# Patient Record
Sex: Male | Born: 1965 | Race: White | Hispanic: Yes | Smoking: Never smoker
Health system: Southern US, Community
[De-identification: ages and names within clinical notes are randomized; demographics above are authoritative.]

---

## 2009-12-17 ENCOUNTER — Emergency Department (HOSPITAL_COMMUNITY): Admission: EM | Admit: 2009-12-17 | Discharge: 2009-12-17 | Payer: Self-pay | Admitting: Emergency Medicine

## 2010-04-14 LAB — DIFFERENTIAL
Basophils Relative: 0 % (ref 0–1)
Eosinophils Absolute: 0.3 10*3/uL (ref 0.0–0.7)
Eosinophils Relative: 2 % (ref 0–5)
Monocytes Absolute: 0.7 10*3/uL (ref 0.1–1.0)
Monocytes Relative: 7 % (ref 3–12)
Neutrophils Relative %: 58 % (ref 43–77)

## 2010-04-14 LAB — POCT I-STAT, CHEM 8
Calcium, Ion: 1.14 mmol/L (ref 1.12–1.32)
Glucose, Bld: 96 mg/dL (ref 70–99)
HCT: 50 % (ref 39.0–52.0)
Hemoglobin: 17 g/dL (ref 13.0–17.0)
TCO2: 27 mmol/L (ref 0–100)

## 2010-04-14 LAB — CBC
HCT: 45.6 % (ref 39.0–52.0)
Hemoglobin: 16 g/dL (ref 13.0–17.0)
MCH: 31.7 pg (ref 26.0–34.0)
MCHC: 35.1 g/dL (ref 30.0–36.0)

## 2011-07-21 ENCOUNTER — Telehealth: Payer: Self-pay | Admitting: Cardiology

## 2011-07-21 NOTE — Telephone Encounter (Signed)
Isaac Ellison states her birth date  is 28-Sep-1965.

## 2011-07-21 NOTE — Telephone Encounter (Signed)
This is not the right patient. The pt is Isaac Ellison.

## 2011-07-21 NOTE — Telephone Encounter (Signed)
Pt calling re heart meds

## 2012-01-27 ENCOUNTER — Encounter (HOSPITAL_COMMUNITY): Payer: Self-pay | Admitting: *Deleted

## 2012-01-27 ENCOUNTER — Emergency Department (HOSPITAL_COMMUNITY): Payer: Self-pay

## 2012-01-27 DIAGNOSIS — F17200 Nicotine dependence, unspecified, uncomplicated: Secondary | ICD-10-CM | POA: Insufficient documentation

## 2012-01-27 DIAGNOSIS — R9431 Abnormal electrocardiogram [ECG] [EKG]: Secondary | ICD-10-CM | POA: Insufficient documentation

## 2012-01-27 DIAGNOSIS — R079 Chest pain, unspecified: Principal | ICD-10-CM | POA: Insufficient documentation

## 2012-01-27 LAB — CBC WITH DIFFERENTIAL/PLATELET
Basophils Relative: 0 % (ref 0–1)
Eosinophils Absolute: 0.2 10*3/uL (ref 0.0–0.7)
HCT: 44.5 % (ref 39.0–52.0)
Hemoglobin: 15.7 g/dL (ref 13.0–17.0)
Lymphs Abs: 4.4 10*3/uL — ABNORMAL HIGH (ref 0.7–4.0)
MCH: 30.4 pg (ref 26.0–34.0)
MCHC: 35.3 g/dL (ref 30.0–36.0)
Monocytes Absolute: 0.5 10*3/uL (ref 0.1–1.0)
Monocytes Relative: 5 % (ref 3–12)

## 2012-01-27 LAB — COMPREHENSIVE METABOLIC PANEL
Albumin: 3.8 g/dL (ref 3.5–5.2)
BUN: 17 mg/dL (ref 6–23)
Chloride: 100 mEq/L (ref 96–112)
Creatinine, Ser: 0.88 mg/dL (ref 0.50–1.35)
GFR calc Af Amer: >90 mL/min (ref >90)
Glucose, Bld: 208 mg/dL — ABNORMAL HIGH (ref 70–99)
Total Bilirubin: 0.2 mg/dL — ABNORMAL LOW (ref 0.3–1.2)

## 2012-01-27 LAB — TROPONIN I: Troponin I: <0.3 ng/mL (ref <0.30)

## 2012-01-27 NOTE — ED Notes (Signed)
The pt is c/o some chest tightness all day intermittently/  He has a hx of anxiety.  History of the same

## 2012-01-28 ENCOUNTER — Observation Stay (HOSPITAL_COMMUNITY): Payer: Self-pay

## 2012-01-28 ENCOUNTER — Observation Stay (HOSPITAL_COMMUNITY)
Admission: EM | Admit: 2012-01-28 | Discharge: 2012-01-28 | Disposition: A | Discharge status: Home / Self Care | Payer: Self-pay | Attending: Emergency Medicine | Admitting: Emergency Medicine

## 2012-01-28 DIAGNOSIS — R079 Chest pain, unspecified: Secondary | ICD-10-CM

## 2012-01-28 LAB — TROPONIN I: Troponin I: <0.3 ng/mL (ref <0.30)

## 2012-01-28 MED ORDER — NITROGLYCERIN 0.4 MG SL SUBL
0.4000 mg | SUBLINGUAL_TABLET | Freq: Once | SUBLINGUAL | Status: AC
  Administered 2012-01-28: 0.4 mg via SUBLINGUAL

## 2012-01-28 MED ORDER — METOPROLOL TARTRATE 25 MG PO TABS
50.0000 mg | ORAL_TABLET | Freq: Once | ORAL | Status: AC
  Administered 2012-01-28: 50 mg via ORAL
  Filled 2012-01-28: qty 2

## 2012-01-28 MED ORDER — ASPIRIN 81 MG PO CHEW
324.0000 mg | CHEWABLE_TABLET | Freq: Once | ORAL | Status: AC
Start: 2012-01-28 — End: 2012-01-28
  Administered 2012-01-28: 324 mg via ORAL
  Filled 2012-01-28: qty 4

## 2012-01-28 MED ORDER — METOPROLOL TARTRATE 1 MG/ML IV SOLN
INTRAVENOUS | Status: AC
  Filled 2012-01-28: qty 10

## 2012-01-28 MED ORDER — IOHEXOL 350 MG/ML SOLN
80.0000 mL | Freq: Once | INTRAVENOUS | Status: AC | PRN
  Administered 2012-01-28: 80 mL via INTRAVENOUS

## 2012-01-28 MED ORDER — NITROGLYCERIN 0.4 MG SL SUBL
SUBLINGUAL_TABLET | SUBLINGUAL | Status: AC
  Administered 2012-01-28: 0.4 mg via SUBLINGUAL
  Filled 2012-01-28: qty 25

## 2012-01-28 NOTE — Progress Notes (Signed)
Utilization review completed.  P.J. Ellias Mcelreath,RN,BSN Case Manager [phone]  

## 2012-01-28 NOTE — ED Provider Notes (Signed)
In CDU on CPP with negative results reported. He has remained pain free and can be discharged home. Will give resource list for outpatient medical care.   Rodena Medin, PA-C 01/28/12 1147

## 2012-01-28 NOTE — ED Notes (Signed)
bmi is 30.8

## 2012-01-28 NOTE — ED Provider Notes (Signed)
History     CSN: [id number]  Arrival date & time 01/27/12  2122   First MD Initiated Contact with Patient 01/28/12 0119      Chief Complaint  Patient presents with  . Chest Pain    (Consider location/radiation/quality/duration/timing/severity/associated sxs/prior treatment) The history is provided by the patient.   on 7 PM tonight developed left-sided chest pain and tightness lasting about 3 hours. Denies any shortness of breath, nausea diaphoresis. 2 years ago had similar pain but has never had a stress test. Pain moderate in severity without known aggravating or alleviating factors. No history of hypertension, diabetes or hyperlipidemia. Is a smoker. No known family history of coronary artery disease. No radiation of pain. No trauma. No rash. No back pain. No leg pain or swelling. History reviewed. No pertinent past medical history.  History reviewed. No pertinent past surgical history.  No family history on file.  History  Substance Use Topics  . Smoking status: Current Every Day Smoker  . Smokeless tobacco: Not on file  . Alcohol Use: Yes      Review of Systems  Constitutional: Negative for fever and chills.  HENT: Negative for neck pain and neck stiffness.   Eyes: Negative for pain.  Respiratory: Negative for shortness of breath.   Cardiovascular: Positive for chest pain.  Gastrointestinal: Negative for abdominal pain.  Genitourinary: Negative for dysuria.  Musculoskeletal: Negative for back pain.  Skin: Negative for rash.  Neurological: Negative for headaches.  All other systems reviewed and are negative.    Allergies  Review of patient's allergies indicates no known allergies.  Home Medications   Current Outpatient Rx  Name  Route  Sig  Dispense  Refill  . ADULT MULTIVITAMIN W/MINERALS CH   Oral   Take 1 tablet by mouth daily.           BP 105/74  Pulse 62  Temp 98.1 F (36.7 C) (Oral)  Resp 15  SpO2 98%  Physical Exam  Constitutional: He  is oriented to person, place, and time. He appears well-developed and well-nourished.  HENT:  Head: Normocephalic and atraumatic.  Eyes: Conjunctivae normal and EOM are normal. Pupils are equal, round, and reactive to light.  Neck: Trachea normal. Neck supple. No thyromegaly present.  Cardiovascular: Normal rate, regular rhythm, S1 normal, S2 normal and normal pulses.     No systolic murmur is present   No diastolic murmur is present  Pulses:      Radial pulses are 2+ on the right side, and 2+ on the left side.  Pulmonary/Chest: Effort normal and breath sounds normal. He has no wheezes. He has no rhonchi. He has no rales. He exhibits no tenderness.  Abdominal: Soft. Normal appearance and bowel sounds are normal. There is no tenderness. There is no CVA tenderness and negative Murphy's sign.  Musculoskeletal:       BLE:s Calves nontender, no cords or erythema, negative Homans sign  Neurological: He is alert and oriented to person, place, and time. He has normal strength. No cranial nerve deficit or sensory deficit. GCS eye subscore is 4. GCS verbal subscore is 5. GCS motor subscore is 6.  Skin: Skin is warm and dry. No rash noted. He is not diaphoretic.  Psychiatric: His speech is normal.       Cooperative and appropriate    ED Course  Procedures (including critical care time)  Results for orders placed during the hospital encounter of 01/28/12  CBC WITH DIFFERENTIAL  Component Value Range   WBC 11.2 (*) 4.0 - 10.5 K/uL   RBC 5.16  4.22 - 5.81 MIL/uL   Hemoglobin 15.7  13.0 - 17.0 g/dL   HCT 45.4  09.8 - 11.9 %   MCV 86.2  78.0 - 100.0 fL   MCH 30.4  26.0 - 34.0 pg   MCHC 35.3  30.0 - 36.0 g/dL   RDW 14.7  82.9 - 56.2 %   Platelets 277  150 - 400 K/uL   Neutrophils Relative 54  43 - 77 %   Neutro Abs 6.0  1.7 - 7.7 K/uL   Lymphocytes Relative 39  12 - 46 %   Lymphs Abs 4.4 (*) 0.7 - 4.0 K/uL   Monocytes Relative 5  3 - 12 %   Monocytes Absolute 0.5  0.1 - 1.0 K/uL    Eosinophils Relative 2  0 - 5 %   Eosinophils Absolute 0.2  0.0 - 0.7 K/uL   Basophils Relative 0  0 - 1 %   Basophils Absolute 0.0  0.0 - 0.1 K/uL  COMPREHENSIVE METABOLIC PANEL      Component Value Range   Sodium 136  135 - 145 mEq/L   Potassium 3.6  3.5 - 5.1 mEq/L   Chloride 100  96 - 112 mEq/L   CO2 23  19 - 32 mEq/L   Glucose, Bld 208 (*) 70 - 99 mg/dL   BUN 17  6 - 23 mg/dL   Creatinine, Ser 1.30  0.50 - 1.35 mg/dL   Calcium 9.3  8.4 - 86.5 mg/dL   Total Protein 7.3  6.0 - 8.3 g/dL   Albumin 3.8  3.5 - 5.2 g/dL   AST 21  0 - 37 U/L   ALT 20  0 - 53 U/L   Alkaline Phosphatase 66  39 - 117 U/L   Total Bilirubin 0.2 (*) 0.3 - 1.2 mg/dL   GFR calc non Af Amer >90  >90 mL/min   GFR calc Af Amer >90  >90 mL/min  TROPONIN I      Component Value Range   Troponin I <0.30  <0.30 ng/mL  TROPONIN I      Component Value Range   Troponin I <0.30  <0.30 ng/mL   Dg Chest 2 View  01/27/2012  *RADIOLOGY REPORT*  Clinical Data: 46 year old male with chest pain.  CHEST - 2 VIEW  Comparison: 12/17/2009 chest radiograph  Findings: The cardiomediastinal silhouette is unremarkable. Probable COPD is noted. There is no evidence of focal airspace disease, pulmonary edema, suspicious pulmonary nodule/mass, pleural effusion, or pneumothorax. No acute bony abnormalities are identified.  IMPRESSION: No evidence of acute cardiopulmonary disease.  Probable COPD.   Original Report Authenticated By: Harmon Pier, M.D.      Date: 01/28/2012  Rate: 93  Rhythm: normal sinus rhythm  QRS Axis: normal  Intervals: normal  ST/T Wave abnormalities: nonspecific ST changes  Conduction Disutrbances:none  Narrative Interpretation:   Old EKG Reviewed: none available  ASA provided.   5'11, wt 230 lb  BMI 32.1 plan CDU OBS CCTA MDM   Chest pain low risk for ACS. Evaluated with EKG, labs and x-rays reviewed as above. Vital signs and nursing notes reviewed. Aspirin provided.  CDU observation protocol plan  coronary CTA in am        Sunnie Nielsen, MD 01/28/12 [phone]

## 2012-01-31 NOTE — Progress Notes (Signed)
UR completed for CDU observation pt 

## 2012-08-17 ENCOUNTER — Encounter (HOSPITAL_COMMUNITY): Payer: Self-pay | Admitting: Emergency Medicine

## 2012-08-17 ENCOUNTER — Emergency Department (HOSPITAL_COMMUNITY)
Admission: EM | Admit: 2012-08-17 | Discharge: 2012-08-17 | Disposition: A | Discharge status: Home / Self Care | Payer: Self-pay | Attending: Emergency Medicine | Admitting: Emergency Medicine

## 2012-08-17 DIAGNOSIS — R6883 Chills (without fever): Secondary | ICD-10-CM

## 2012-08-17 DIAGNOSIS — F17200 Nicotine dependence, unspecified, uncomplicated: Secondary | ICD-10-CM | POA: Insufficient documentation

## 2012-08-17 LAB — BASIC METABOLIC PANEL
Calcium: 8.6 mg/dL (ref 8.4–10.5)
Creatinine, Ser: 1.13 mg/dL (ref 0.50–1.35)
GFR calc non Af Amer: 77 mL/min — ABNORMAL LOW (ref >90)
Sodium: 135 mEq/L (ref 135–145)

## 2012-08-17 LAB — CBC WITH DIFFERENTIAL/PLATELET
Basophils Absolute: 0 10*3/uL (ref 0.0–0.1)
Eosinophils Absolute: 0.3 10*3/uL (ref 0.0–0.7)
Lymphocytes Relative: 41 % (ref 12–46)
Lymphs Abs: 3.5 10*3/uL (ref 0.7–4.0)
Neutrophils Relative %: 48 % (ref 43–77)
Platelets: 222 10*3/uL (ref 150–400)
RBC: 4.7 MIL/uL (ref 4.22–5.81)
RDW: 12.4 % (ref 11.5–15.5)
WBC: 8.5 10*3/uL (ref 4.0–10.5)

## 2012-08-17 NOTE — ED Provider Notes (Signed)
   History    CSN: [id number] Arrival date & time 08/17/12  0146  First MD Initiated Contact with Patient 08/17/12 0202     Chief Complaint  Patient presents with  . Chills   (Consider location/radiation/quality/duration/timing/severity/associated sxs/prior Treatment) HPI Comments: Pt comes in with cc of chills x 2 days. No medical hx, and states that the chills are not associated with uri like sx, myalgias, weight loss, loss in appetite, night sweats, cough. Pt also denies nausea, emesis, fevers, chills, chest pains, shortness of breath, headaches, abdominal pain, uti like symptoms.   The history is provided by the patient.   History reviewed. No pertinent past medical history. History reviewed. No pertinent past surgical history. No family history on file. History  Substance Use Topics  . Smoking status: Current Every Day Smoker  . Smokeless tobacco: Not on file  . Alcohol Use: Yes    Review of Systems  Constitutional: Positive for chills. Negative for fever, diaphoresis, activity change and appetite change.  Respiratory: Negative for cough and shortness of breath.   Cardiovascular: Negative for chest pain.  Gastrointestinal: Negative for abdominal pain.  Genitourinary: Negative for dysuria.    Allergies  Review of patient's allergies indicates no known allergies.  Home Medications   Current Outpatient Rx  Name  Route  Sig  Dispense  Refill  . Multiple Vitamin (MULTIVITAMIN WITH MINERALS) TABS   Oral   Take 1 tablet by mouth daily.          BP 127/84  Pulse 71  Temp(Src) 98.4 F (36.9 C) (Oral)  Resp 16  SpO2 98% Physical Exam  Constitutional: He is oriented to person, place, and time. He appears well-developed.  HENT:  Head: Normocephalic and atraumatic.  Eyes: Conjunctivae and EOM are normal. Pupils are equal, round, and reactive to light.  Neck: Normal range of motion. Neck supple.  Cardiovascular: Normal rate and regular rhythm.   Pulmonary/Chest:  Effort normal and breath sounds normal.  Abdominal: Soft. Bowel sounds are normal. He exhibits no distension. There is no tenderness. There is no rebound and no guarding.  Neurological: He is alert and oriented to person, place, and time.  Skin: Skin is warm.    ED Course  Procedures (including critical care time) Labs Reviewed  BASIC METABOLIC PANEL - Abnormal; Notable for the following:    Glucose, Bld 107 (*)    GFR calc non Af Amer 77 (*)    GFR calc Af Amer 90 (*)    All other components within normal limits  CBC WITH DIFFERENTIAL - Abnormal; Notable for the following:    MCHC 36.1 (*)    All other components within normal limits   No results found. 1. Chills (without fever)     MDM  Pt with night chills. No recent foreign travels. No red flags on ROS for any systemic infections, cancer. No night sweats. Will get basic screening labs, to ensure there was no concerns for leukemia, lymphoma. PCP f/u requested.     Derwood Kaplan, MD 08/17/12 [phone]

## 2012-08-17 NOTE — ED Notes (Signed)
Pt denies N/V/D, body aches, fever

## 2012-08-17 NOTE — ED Notes (Signed)
Nanavati MD at bedside 

## 2012-08-17 NOTE — ED Notes (Signed)
PT. REPORTS CHILLS " FEELS COLD" AND INTERMITTENT LEGS MUSCLE CRAMPS FOR 3 DAYS  , DENIES FEVER .

## 2012-08-23 ENCOUNTER — Ambulatory Visit: Payer: No Typology Code available for payment source | Attending: Family Medicine | Admitting: Internal Medicine

## 2012-08-23 VITALS — BP 102/69 | HR 56 | Temp 98.6°F | Resp 16 | Ht 70.0 in | Wt 213.0 lb

## 2012-08-23 DIAGNOSIS — Z0000 Encounter for general adult medical examination without abnormal findings: Secondary | ICD-10-CM

## 2012-08-23 NOTE — Progress Notes (Signed)
Patient ID: Isaac Ellison, male   DOB: 17-Sep-1965, 47 y.o.   MRN: [id number]   HPI: This is a 47 year old male who comes in to establish care. He has no complaints. No Known Allergies History reviewed. No pertinent past medical history. Prior to Admission medications   Medication Sig Start Date End Date Taking? Authorizing Provider  Multiple Vitamin (MULTIVITAMIN WITH MINERALS) TABS Take 1 tablet by mouth daily.    Historical Provider, MD   History reviewed. No pertinent family history. History   Social History  . Marital Status: Married    Spouse Name: N/A    Number of Children: N/A  . Years of Education: N/A   Occupational History  . Not on file.   Social History Main Topics  . Smoking status: Former Smoker    Quit date: 06/23/2012  . Smokeless tobacco: Not on file  . Alcohol Use: Yes  . Drug Use: Not on file  . Sexually Active: Not on file   Other Topics Concern  . Not on file   Social History Narrative  . No narrative on file    Review of Systems ______ Constitutional: Negative for fever, chills, diaphoresis, activity change, appetite change and fatigue. ____ HENT: Negative for ear pain, nosebleeds, congestion, facial swelling, rhinorrhea, neck pain, neck stiffness and ear discharge.  ____ Eyes: Negative for pain, discharge, redness, itching and visual disturbance. ____ Respiratory: Negative for cough, choking, chest tightness, shortness of breath, wheezing and stridor.  ____ Cardiovascular: Negative for chest pain, palpitations and leg swelling. ____ Gastrointestinal: Negative for Nausea/ Vomiting/ Diarrhea or Consitpation Genitourinary: Negative for dysuria, urgency, frequency, hematuria, flank pain, decreased urine volume, difficulty urinating and dyspareunia. ____ Musculoskeletal: Negative for back pain, joint swelling, arthralgias and gait problem. ________ Neurological: Negative for dizziness, tremors, seizures, syncope, facial asymmetry, speech  difficulty, weakness, light-headedness, numbness and headaches. ____ Hematological: Negative for adenopathy. Does not bruise/bleed easily. ____ Psychiatric/Behavioral: Negative for hallucinations, behavioral problems, confusion, dysphoric mood, decreased concentration and agitation. ______ Teeth bleed when brushing  Objective:   Filed Vitals:   08/23/12 1746  BP: 102/69  Pulse: 56  Temp: 98.6 F (37 C)  Resp: 16    Physical Exam ______ Constitutional: Appears well-developed and well-nourished. No distress. ____ HENT: Normocephalic. External right and left ear normal. Oropharynx is clear and moist. ____ Eyes: Conjunctivae and EOM are normal. PERRLA, no scleral icterus. ____ Neck: Normal ROM. Neck supple. No JVD. No tracheal deviation. No thyromegaly. ____ CVS: RRR, S1/S2 +, no murmurs, no gallops, no carotid bruit.  Pulmonary: Effort and breath sounds normal, no stridor, rhonchi, wheezes, rales.  Abdominal: Soft. BS +,  no distension, tenderness, rebound or guarding. ________ Musculoskeletal: Normal range of motion. No edema and no tenderness. ____ Lymphadenopathy: No lymphadenopathy noted, cervical, inguinal. Neuro: Alert. Normal reflexes, muscle tone coordination. No cranial nerve deficit. Skin: Skin is warm and dry. No rash noted. Not diaphoretic. No erythema. No pallor. ____ Psychiatric: Normal mood and affect. Behavior, judgment, thought content normal. __  Lab Results  Component Value Date   WBC 8.5 08/17/2012   HGB 14.9 08/17/2012   HCT 41.3 08/17/2012   MCV 87.9 08/17/2012   PLT 222 08/17/2012   Lab Results  Component Value Date   CREATININE 1.13 08/17/2012   BUN 22 08/17/2012   NA 135 08/17/2012   K 3.9 08/17/2012   CL 103 08/17/2012   CO2 27 08/17/2012    No results found for this basename: HGBA1C   Lipid Panel  No results  found for this basename: chol, trig, hdl, cholhdl, vldl, ldlcalc       Assessment and plan:   Patient Active Problem List   Diagnosis Date  Noted  . Annual physical exam 08/23/2012     No further workup at this time-patient is advised to return as needed.

## 2012-08-23 NOTE — Progress Notes (Signed)
Patient presents for Hospital follow up from last Sunday night. States that he was feeling very cold at night and couldn't sleep. States that during this time he has episodes of his, "mind going wild."

## 2012-08-28 ENCOUNTER — Encounter (HOSPITAL_COMMUNITY): Payer: Self-pay | Admitting: *Deleted

## 2012-08-28 ENCOUNTER — Emergency Department (HOSPITAL_COMMUNITY): Payer: Self-pay

## 2012-08-28 DIAGNOSIS — Z87891 Personal history of nicotine dependence: Secondary | ICD-10-CM | POA: Insufficient documentation

## 2012-08-28 DIAGNOSIS — R079 Chest pain, unspecified: Secondary | ICD-10-CM | POA: Insufficient documentation

## 2012-08-28 DIAGNOSIS — R1013 Epigastric pain: Secondary | ICD-10-CM | POA: Insufficient documentation

## 2012-08-28 DIAGNOSIS — A852 Arthropod-borne viral encephalitis, unspecified: Secondary | ICD-10-CM | POA: Insufficient documentation

## 2012-08-28 DIAGNOSIS — F411 Generalized anxiety disorder: Secondary | ICD-10-CM | POA: Insufficient documentation

## 2012-08-28 DIAGNOSIS — Z79899 Other long term (current) drug therapy: Secondary | ICD-10-CM | POA: Insufficient documentation

## 2012-08-28 NOTE — ED Notes (Addendum)
C/o chills. Also mentions acid reflux, indigestion & ulcer. Has been seen for similar twice in last 2 weeks. Has been chewing Tums after eating. Alert, NAD, calm, interactive, skin W&D, resps e/u, speaking in clear complete sentences. Last BM 3 hrs ago(normal), last ate 1800 (no problems). Denies nvd or fever. Noticed some blood when wiping after BM earlier, but not the last BM 3 hrs ago. Pt anxious about continued sx. Verbalizes anxiety.

## 2012-08-28 NOTE — ED Notes (Signed)
Pt states, "I was instructed by family to read about panic attacks, I don't panic like I use to".

## 2012-08-29 ENCOUNTER — Emergency Department (HOSPITAL_COMMUNITY)
Admission: EM | Admit: 2012-08-29 | Discharge: 2012-08-29 | Disposition: A | Discharge status: Home / Self Care | Payer: Self-pay | Attending: Emergency Medicine | Admitting: Emergency Medicine

## 2012-08-29 DIAGNOSIS — R079 Chest pain, unspecified: Secondary | ICD-10-CM

## 2012-08-29 DIAGNOSIS — R1013 Epigastric pain: Secondary | ICD-10-CM

## 2012-08-29 LAB — CBC WITH DIFFERENTIAL/PLATELET
Basophils Absolute: 0 10*3/uL (ref 0.0–0.1)
Eosinophils Relative: 3 % (ref 0–5)
HCT: 42.2 % (ref 39.0–52.0)
Lymphocytes Relative: 41 % (ref 12–46)
Lymphs Abs: 3.5 10*3/uL (ref 0.7–4.0)
MCV: 87.2 fL (ref 78.0–100.0)
Neutro Abs: 4.2 10*3/uL (ref 1.7–7.7)
Platelets: 243 10*3/uL (ref 150–400)
RBC: 4.84 MIL/uL (ref 4.22–5.81)
RDW: 12.5 % (ref 11.5–15.5)
WBC: 8.4 10*3/uL (ref 4.0–10.5)

## 2012-08-29 LAB — POCT I-STAT TROPONIN I: Troponin i, poc: 0 ng/mL (ref 0.00–0.08)

## 2012-08-29 MED ORDER — LORAZEPAM 1 MG PO TABS
1.0000 mg | ORAL_TABLET | Freq: Once | ORAL | Status: AC
  Administered 2012-08-29: 1 mg via ORAL
  Filled 2012-08-29: qty 1

## 2012-08-29 MED ORDER — FAMOTIDINE 20 MG PO TABS: 20.0000 mg | ORAL_TABLET | Freq: Two times a day (BID) | ORAL | Status: DC

## 2012-08-29 MED ORDER — GI COCKTAIL ~~LOC~~
30.0000 mL | Freq: Once | ORAL | Status: AC
  Administered 2012-08-29: 30 mL via ORAL
  Filled 2012-08-29: qty 30

## 2012-08-29 MED ORDER — PANTOPRAZOLE SODIUM 40 MG PO TBEC
40.0000 mg | DELAYED_RELEASE_TABLET | Freq: Once | ORAL | Status: AC
  Administered 2012-08-29: 40 mg via ORAL
  Filled 2012-08-29: qty 1

## 2012-08-29 NOTE — ED Provider Notes (Signed)
CSN: [id number]     Arrival date & time 08/28/12  2306 History     First MD Initiated Contact with Patient 08/29/12 0321     Chief Complaint  Patient presents with  . Chills  . Gastrophageal Reflux  . Anxiety   (Consider location/radiation/quality/duration/timing/severity/associated sxs/prior Treatment) Patient is a 47 y.o. male presenting with GERD and anxiety.  Gastrophageal Reflux Associated symptoms include chest pain and abdominal pain. Pertinent negatives include no headaches and no shortness of breath.  Anxiety Associated symptoms include chest pain and abdominal pain. Pertinent negatives include no headaches and no shortness of breath.   Hx per PT -  Onset 4 days ago, every time he eats, he gets epigastric "burning" pain, sometimes radiates into his chest.  He gets very worried when pain does not get better with tums.  No SOB, no nausea. No emesis. He also gets chills on and off, evaluated here last week for the same. No PCP. Symptoms MOD in severity. No back pain, no SOB. No leg pain or swelling. Minimal symptoms at this time. Spicy foods and tomato sauce seem to make this worse.   History reviewed. No pertinent past medical history. History reviewed. No pertinent past surgical history. No family history on file. History  Substance Use Topics  . Smoking status: Former Smoker    Quit date: 06/23/2012  . Smokeless tobacco: Not on file  . Alcohol Use: Yes    Review of Systems  Constitutional: Negative for fever and chills.  HENT: Negative for neck pain and neck stiffness.   Eyes: Negative for pain.  Respiratory: Negative for shortness of breath.   Cardiovascular: Positive for chest pain.  Gastrointestinal: Positive for abdominal pain.  Genitourinary: Negative for dysuria.  Musculoskeletal: Negative for back pain.  Skin: Negative for rash.  Neurological: Negative for headaches.  All other systems reviewed and are negative.    Allergies  Review of patient's  allergies indicates no known allergies.  Home Medications   Current Outpatient Rx  Name  Route  Sig  Dispense  Refill  . calcium carbonate (TUMS EX) 750 MG chewable tablet   Oral   Chew 1 tablet by mouth 3 (three) times daily as needed for heartburn.          . Multiple Vitamin (MULTIVITAMIN WITH MINERALS) TABS   Oral   Take 1 tablet by mouth daily.          BP 125/81  Temp(Src) 99 F (37.2 C) (Oral)  Resp 18  SpO2 97% Physical Exam  Constitutional: He is oriented to person, place, and time. He appears well-developed and well-nourished.  HENT:  Head: Normocephalic and atraumatic.  Eyes: EOM are normal. Pupils are equal, round, and reactive to light.  Neck: Neck supple.  Cardiovascular: Normal rate, regular rhythm and intact distal pulses.   Pulmonary/Chest: Effort normal and breath sounds normal. No respiratory distress.  Abdominal: Soft. Bowel sounds are normal. He exhibits no distension. There is no rebound and no guarding.  Mild epigastric tenderness  Musculoskeletal: Normal range of motion. He exhibits no edema.  Neurological: He is alert and oriented to person, place, and time.  Skin: Skin is warm and dry.  Psychiatric:  Anxious    ED Course   Procedures (including critical care time)   Date: 08/29/2012  Rate: 67  Rhythm: normal sinus rhythm  QRS Axis: normal  Intervals: normal  ST/T Wave abnormalities: nonspecific ST changes  Conduction Disutrbances:none  Narrative Interpretation:   Old EKG Reviewed: none  available  Results for orders placed during the hospital encounter of 08/29/12  CBC WITH DIFFERENTIAL      Result Value Range   WBC 8.4  4.0 - 10.5 K/uL   RBC 4.84  4.22 - 5.81 MIL/uL   Hemoglobin 15.1  13.0 - 17.0 g/dL   HCT 16.1  09.6 - 04.5 %   MCV 87.2  78.0 - 100.0 fL   MCH 31.2  26.0 - 34.0 pg   MCHC 35.8  30.0 - 36.0 g/dL   RDW 40.9  81.1 - 91.4 %   Platelets 243  150 - 400 K/uL   Neutrophils Relative % 50  43 - 77 %   Neutro Abs 4.2   1.7 - 7.7 K/uL   Lymphocytes Relative 41  12 - 46 %   Lymphs Abs 3.5  0.7 - 4.0 K/uL   Monocytes Relative 6  3 - 12 %   Monocytes Absolute 0.5  0.1 - 1.0 K/uL   Eosinophils Relative 3  0 - 5 %   Eosinophils Absolute 0.3  0.0 - 0.7 K/uL   Basophils Relative 0  0 - 1 %   Basophils Absolute 0.0  0.0 - 0.1 K/uL  POCT I-STAT TROPONIN I      Result Value Range   Troponin i, poc 0.00  0.00 - 0.08 ng/mL   Comment 3            Dg Chest 2 View  08/29/2012   *RADIOLOGY REPORT*  Clinical Data: Chills.  Gastroesophageal reflux.  CHEST - 2 VIEW  Comparison: CT 01/28/2012.  Findings:  Cardiopericardial silhouette within normal limits. Mediastinal contours normal. Trachea midline.  No airspace disease or effusion.  Linear scarring is present in the lingula.  No interval change compared to prior.  IMPRESSION: No active cardiopulmonary disease.   Original Report Authenticated By: Andreas Newport, M.D.    GI cocktail. Ativan. Protonix.  1. epigastric pain 2. chest pain  Plan discharge home with outpatient followup. Continue to calm this and add on Pepcid with dietary precautions provided. Return precautions verbalized as understood. States understanding of discharge and followup instructions. Outpatient referrals provided MDM  Epigastric burning pain worse after eating spicy food.   Low risk per ACS with associated chest pain.  Chest x-ray review as above. screening EKG reviewed as above Negative troponin with 4 days of symptoms, do not feel serial troponins are indicated at this point CBC reviewed, no anemia and no report of black or tarry stools  Improved with GI cocktail and medications provided  Vital signs the nurses notes reviewed and considered     Sunnie Nielsen, MD 08/29/12 [phone]

## 2012-08-30 ENCOUNTER — Emergency Department (HOSPITAL_COMMUNITY)
Admission: EM | Admit: 2012-08-30 | Discharge: 2012-08-31 | Disposition: A | Discharge status: Home / Self Care | Payer: Self-pay | Attending: Emergency Medicine | Admitting: Emergency Medicine

## 2012-08-30 ENCOUNTER — Encounter (HOSPITAL_COMMUNITY): Payer: Self-pay | Admitting: Emergency Medicine

## 2012-08-30 ENCOUNTER — Emergency Department (HOSPITAL_COMMUNITY): Payer: Self-pay

## 2012-08-30 DIAGNOSIS — F419 Anxiety disorder, unspecified: Secondary | ICD-10-CM

## 2012-08-30 DIAGNOSIS — F411 Generalized anxiety disorder: Secondary | ICD-10-CM | POA: Insufficient documentation

## 2012-08-30 DIAGNOSIS — R110 Nausea: Secondary | ICD-10-CM | POA: Insufficient documentation

## 2012-08-30 DIAGNOSIS — R0789 Other chest pain: Secondary | ICD-10-CM | POA: Insufficient documentation

## 2012-08-30 DIAGNOSIS — Z87891 Personal history of nicotine dependence: Secondary | ICD-10-CM | POA: Insufficient documentation

## 2012-08-30 DIAGNOSIS — R079 Chest pain, unspecified: Secondary | ICD-10-CM

## 2012-08-30 LAB — BASIC METABOLIC PANEL
CO2: 25 mEq/L (ref 19–32)
Chloride: 108 mEq/L (ref 96–112)
Glucose, Bld: 98 mg/dL (ref 70–99)
Potassium: 3.9 mEq/L (ref 3.5–5.1)
Sodium: 141 mEq/L (ref 135–145)

## 2012-08-30 LAB — CBC
Hemoglobin: 15.5 g/dL (ref 13.0–17.0)
MCH: 32 pg (ref 26.0–34.0)
Platelets: 247 10*3/uL (ref 150–400)
RBC: 4.85 MIL/uL (ref 4.22–5.81)
WBC: 8.5 10*3/uL (ref 4.0–10.5)

## 2012-08-30 LAB — POCT I-STAT TROPONIN I: Troponin i, poc: 0 ng/mL (ref 0.00–0.08)

## 2012-08-30 NOTE — ED Provider Notes (Signed)
CSN: [id number]     Arrival date & time 08/30/12  1853 History     First MD Initiated Contact with Patient 08/30/12 1935     Chief Complaint  Patient presents with  . Chest Pain   (Consider location/radiation/quality/duration/timing/severity/associated sxs/prior Treatment) HPI Isaac Ellison 47 y.o. who denies any specific past medical history presents with chest pain. Of note patient was seen here yesterday in multiple other times in the past year for similar symptoms. Chest pain started more than 1 month ago. He presents today because he noted that it was going into his left arm, and it made him nauseated. He describes the pain is pressure-like, radiates to his left arm, no known exacerbating or relieving factors, moderate in severity. He does note that he "when I think about the pain I become anxious and nervous and the pain becomes worse". He also notes "when I keep my mind busy with other things I think the pain gets better as well".  History reviewed. No pertinent past medical history. History reviewed. No pertinent past surgical history. History reviewed. No pertinent family history. History  Substance Use Topics  . Smoking status: Former Smoker    Quit date: 06/23/2012  . Smokeless tobacco: Not on file  . Alcohol Use: Yes    Review of Systems  Constitutional: Negative for fever, chills, diaphoresis, activity change, appetite change and fatigue.  HENT: Negative for congestion, sore throat, rhinorrhea, sneezing and trouble swallowing.   Eyes: Negative for pain and redness.  Respiratory: Positive for chest tightness. Negative for cough, choking, shortness of breath, wheezing and stridor.   Cardiovascular: Positive for chest pain. Negative for leg swelling.  Gastrointestinal: Positive for nausea. Negative for vomiting, diarrhea, constipation, blood in stool, abdominal distention and anal bleeding.  Musculoskeletal: Negative for myalgias and back pain.  Skin: Negative for  rash.  Neurological: Negative for dizziness, speech difficulty, weakness, light-headedness, numbness and headaches.  Hematological: Negative for adenopathy.  Psychiatric/Behavioral: Negative for confusion.    Allergies  Review of patient's allergies indicates no known allergies.  Home Medications   Current Outpatient Rx  Name  Route  Sig  Dispense  Refill  . famotidine (PEPCID) 20 MG tablet   Oral   Take 1 tablet (20 mg total) by mouth 2 (two) times daily.   30 tablet   0    BP 108/69  Pulse 57  Temp(Src) 98.5 F (36.9 C) (Oral)  Resp 17  SpO2 99% Physical Exam  Nursing note and vitals reviewed. Constitutional: He is oriented to person, place, and time. He appears well-developed and well-nourished. No distress.  HENT:  Head: Normocephalic and atraumatic.  Eyes: Conjunctivae and EOM are normal. Right eye exhibits no discharge. Left eye exhibits no discharge.  Neck: Normal range of motion. Neck supple. No tracheal deviation present.  Cardiovascular: Normal rate, regular rhythm and normal heart sounds.  Exam reveals no friction rub.   No murmur heard. Pulmonary/Chest: Effort normal and breath sounds normal. No stridor. No respiratory distress. He has no wheezes. He has no rales. He exhibits no tenderness.  Abdominal: Soft. He exhibits no distension. There is no tenderness. There is no rebound and no guarding.  Neurological: He is alert and oriented to person, place, and time.  Skin: Skin is warm.  Psychiatric: He has a normal mood and affect.    ED Course   Procedures (including critical care time)  Labs Reviewed  CBC - Abnormal; Notable for the following:    MCHC 36.6 (*)  All other components within normal limits  BASIC METABOLIC PANEL - Abnormal; Notable for the following:    GFR calc non Af Amer 83 (*)    All other components within normal limits  POCT I-STAT TROPONIN I  POCT I-STAT TROPONIN I   Results for orders placed during the hospital encounter of  08/30/12  CBC      Result Value Range   WBC 8.5  4.0 - 10.5 K/uL   RBC 4.85  4.22 - 5.81 MIL/uL   Hemoglobin 15.5  13.0 - 17.0 g/dL   HCT 16.1  09.6 - 04.5 %   MCV 87.4  78.0 - 100.0 fL   MCH 32.0  26.0 - 34.0 pg   MCHC 36.6 (*) 30.0 - 36.0 g/dL   RDW 40.9  81.1 - 91.4 %   Platelets 247  150 - 400 K/uL  BASIC METABOLIC PANEL      Result Value Range   Sodium 141  135 - 145 mEq/L   Potassium 3.9  3.5 - 5.1 mEq/L   Chloride 108  96 - 112 mEq/L   CO2 25  19 - 32 mEq/L   Glucose, Bld 98  70 - 99 mg/dL   BUN 20  6 - 23 mg/dL   Creatinine, Ser 7.82  0.50 - 1.35 mg/dL   Calcium 9.2  8.4 - 95.6 mg/dL   GFR calc non Af Amer 83 (*) >90 mL/min   GFR calc Af Amer >90  >90 mL/min  POCT I-STAT TROPONIN I      Result Value Range   Troponin i, poc 0.00  0.00 - 0.08 ng/mL   Comment 3           POCT I-STAT TROPONIN I      Result Value Range   Troponin i, poc 0.00  0.00 - 0.08 ng/mL   Comment 3             Dg Chest 2 View  08/30/2012   *RADIOLOGY REPORT*  Clinical Data: Chest pain and pressure.  CHEST - 2 VIEW  Comparison: 08/28/2012  Findings: No pneumothorax.  Spurring in the lower thoracic spine. Lungs clear.  Heart size and pulmonary vascularity normal.  No effusion.  IMPRESSION: No acute disease   Original Report Authenticated By: D. Andria Rhein, MD   1. Chest pain   2. Anxiety       Date: 08/30/2012  Rate: 64  Rhythm: normal sinus rhythm  QRS Axis: normal  Intervals: normal  ST/T Wave abnormalities: nonspecific T wave changes  Conduction Disutrbances:none  Narrative Interpretation: NSR unchanged from EKG on 08/29/2012  Old EKG Reviewed: unchanged   MDM  Lennox Laity 47 y.o. present for chest pain. He's been seen multiple times for chest pain in the past year. Workup has included a CT coronary study in December 2013 which showed no evidence of coronary artery disease. He was also seen yesterday where there was no cardiac etiology determined the symptoms. Patient reports  no risk factors including high blood pressure or high cholesterol, although this is noted in his past medical history. He's afebrile vital signs are stable. Is in no acute distress. No respiratory distress. Protocol driven labs obtained. Troponin negative. Hemoglobin stable. Chest x-ray shows no pneumonia or pneumothorax. EKG is normal sinus rhythm and unchanged from yesterday's and prior. Patient has a HEART score of less than 3, which categorizes in his low risk. Will obtain a delta troponin and if negative patient is safe to be discharged.  How low suspicion for ACS at this time.  Delta troponin negative. A low suspicion etiology of chest pain is cardiac in origin.  Patient was given information establish care with primary medical doctor. He was instructed to followup for her symptoms. I suspect there is a component of anxiety. Given the history described above. He was given strong return precautions.  Labs, EKG, and imaging reviewed. I discussed patient's care at my attending, Dr. Loretha Stapler.     Sena Hitch, MD 08/31/12 [phone]

## 2012-08-30 NOTE — ED Notes (Signed)
Pt to ED for evaluation of left sided chest pain that radiates to his left arm that began this morning- shortness of breath associated with symptoms.  Admits to being under increased stress this week with his living situation and family.  Pt also concerned because [employer] informed him that their fruit was exposed to a bacteria, pt states he has eaten their fruit and concerned that could be where his symptoms are coming from.  Pt anxious at present, alert and oriented X 4 - pt on cardiac monitor, will continue to monitor pt.

## 2012-08-30 NOTE — ED Notes (Signed)
Pt c/o chest pressure with radiation to left arm starting today; pt seen here for similar recently

## 2012-08-31 ENCOUNTER — Ambulatory Visit: Payer: Self-pay | Attending: Family Medicine

## 2012-08-31 DIAGNOSIS — R079 Chest pain, unspecified: Secondary | ICD-10-CM | POA: Diagnosis present

## 2012-08-31 DIAGNOSIS — F419 Anxiety disorder, unspecified: Secondary | ICD-10-CM | POA: Diagnosis present

## 2012-08-31 LAB — POCT I-STAT TROPONIN I

## 2012-09-01 NOTE — ED Provider Notes (Signed)
I saw and evaluated the patient, reviewed the resident's note and I agree with the findings and plan.  47 yo male with chest pain.  Well appearing, no distress, and normal heart sounds.  History suggests anxiety as most likely cause of chest pain.    Candyce Churn, MD 09/01/12 1331

## 2012-09-25 ENCOUNTER — Ambulatory Visit: Payer: No Typology Code available for payment source | Attending: Internal Medicine | Admitting: Internal Medicine

## 2012-09-25 ENCOUNTER — Encounter: Payer: Self-pay | Admitting: Internal Medicine

## 2012-09-25 VITALS — BP 114/78 | HR 66 | Temp 98.6°F | Resp 16 | Ht 70.0 in | Wt 211.0 lb

## 2012-09-25 DIAGNOSIS — K029 Dental caries, unspecified: Secondary | ICD-10-CM | POA: Insufficient documentation

## 2012-09-25 DIAGNOSIS — Z0000 Encounter for general adult medical examination without abnormal findings: Secondary | ICD-10-CM

## 2012-09-25 DIAGNOSIS — R079 Chest pain, unspecified: Secondary | ICD-10-CM | POA: Insufficient documentation

## 2012-09-25 DIAGNOSIS — F411 Generalized anxiety disorder: Secondary | ICD-10-CM | POA: Insufficient documentation

## 2012-09-25 MED ORDER — CARBAMIDE PEROXIDE 6.5 % OT SOLN: 5.0000 [drp] | Freq: Two times a day (BID) | OTIC | Status: DC

## 2012-09-25 NOTE — Progress Notes (Signed)
Patient ID: Isaac Ellison, male   DOB: 1965-07-05, 47 y.o.   MRN: [id number]  CC: Annual physical  HPI: Patient is a 47 years old male who came to clinic today for physical. He has no specific complaint except for tooth ache for which he wants a referral to dentist. Denies chest pain, no headache, no abdominal pain, no change in bowel habit, no urinary symptoms.  No Known Allergies History reviewed. No pertinent past medical history. Current Outpatient Prescriptions on File Prior to Visit  Medication Sig Dispense Refill  . famotidine (PEPCID) 20 MG tablet Take 1 tablet (20 mg total) by mouth 2 (two) times daily.  30 tablet  0   No current facility-administered medications on file prior to visit.   History reviewed. No pertinent family history. History   Social History  . Marital Status: Married    Spouse Name: N/A    Number of Children: N/A  . Years of Education: N/A   Occupational History  . Not on file.   Social History Main Topics  . Smoking status: Former Smoker    Quit date: 06/23/2012  . Smokeless tobacco: Not on file  . Alcohol Use: Yes  . Drug Use: Not on file  . Sexual Activity: Not on file   Other Topics Concern  . Not on file   Social History Narrative  . No narrative on file    Review of Systems: Constitutional: Negative for fever, chills, diaphoresis, activity change, appetite change and fatigue. HENT: Negative for ear pain, nosebleeds, congestion, facial swelling, rhinorrhea, neck pain, neck stiffness and ear discharge.  Eyes: Negative for pain, discharge, redness, itching and visual disturbance. Respiratory: Negative for cough, choking, chest tightness, shortness of breath, wheezing and stridor.  Cardiovascular: Negative for chest pain, palpitations and leg swelling. Gastrointestinal: Negative for abdominal distention. Genitourinary: Negative for dysuria, urgency, frequency, hematuria, flank pain, decreased urine volume, difficulty urinating and  dyspareunia.  Musculoskeletal: Negative for back pain, joint swelling, arthralgias and gait problem. Neurological: Negative for dizziness, tremors, seizures, syncope, facial asymmetry, speech difficulty, weakness, light-headedness, numbness and headaches.  Hematological: Negative for adenopathy. Does not bruise/bleed easily. Psychiatric/Behavioral: Negative for hallucinations, behavioral problems, confusion, dysphoric mood, decreased concentration and agitation.    Objective:   Filed Vitals:   09/25/12 1712  BP: 114/78  Pulse: 66  Temp: 98.6 F (37 C)  Resp: 16    Physical Exam: Constitutional: Patient appears well-developed and well-nourished. No distress. HENT: Normocephalic, atraumatic, External right and left ear normal. Oropharynx is clear and moist.  Eyes: Conjunctivae and EOM are normal. PERRLA, no scleral icterus. Neck: Normal ROM. Neck supple. No JVD. No tracheal deviation. No thyromegaly. CVS: RRR, S1/S2 +, no murmurs, no gallops, no carotid bruit.  Pulmonary: Effort and breath sounds normal, no stridor, rhonchi, wheezes, rales.  Abdominal: Soft. BS +,  no distension, tenderness, rebound or guarding.  Musculoskeletal: Normal range of motion. No edema and no tenderness.  Lymphadenopathy: No lymphadenopathy noted, cervical, inguinal or axillary Neuro: Alert. Normal reflexes, muscle tone coordination. No cranial nerve deficit. Skin: Multiple tiny hemangiomas on the trunk Psychiatric: Normal mood and affect. Behavior, judgment, thought content normal.  Lab Results  Component Value Date   WBC 8.5 08/30/2012   HGB 15.5 08/30/2012   HCT 42.4 08/30/2012   MCV 87.4 08/30/2012   PLT 247 08/30/2012   Lab Results  Component Value Date   CREATININE 1.07 08/30/2012   BUN 20 08/30/2012   NA 141 08/30/2012   K 3.9 08/30/2012  CL 108 08/30/2012   CO2 25 08/30/2012    No results found for this basename: HGBA1C   Lipid Panel  No results found for this basename: chol, trig, hdl,  cholhdl, vldl, ldlcalc       Assessment and plan:   Patient Active Problem List   Diagnosis Date Noted  . Dental caries 09/25/2012  . Routine history and physical examination of adult 09/25/2012  . Chest pain 08/31/2012  . Anxiety 08/31/2012  . Annual physical exam 08/23/2012   Ambulatory referral to dentist for dental caries General physical examination done Copious ear wax both ears  Labs: CMP CBC D. Complete urinalysis Lipid panel TSH  Patient counseled about nutrition and exercise Patient encouraged to reduce alcohol consumption Ear wax removal drops prescribed  Johnluke Haugen was given clear instructions to go to ER or return to the clinic if symptoms don't improve, worsen or new problems develop.  Mildred Rodriguez-Romero verbalized understanding.  Tage Feggins was told to call to get lab results if hasn't heard anything in the next week.        Jeanann Lewandowsky, MD [facility] [place], [place] [phone]   09/25/2012, 5:48 PM

## 2012-09-25 NOTE — Progress Notes (Signed)
PT HERE FOR ANNUAL PHYSICAL AND DENTAL REFERRAL. DENIES PAIN AT THIS TIME.VSS

## 2012-09-26 LAB — CBC WITH DIFFERENTIAL/PLATELET
Basophils Absolute: 0 10*3/uL (ref 0.0–0.1)
Basophils Relative: 0 % (ref 0–1)
Eosinophils Absolute: 0.2 10*3/uL (ref 0.0–0.7)
MCH: 30.5 pg (ref 26.0–34.0)
MCHC: 35 g/dL (ref 30.0–36.0)
Monocytes Relative: 6 % (ref 3–12)
Neutrophils Relative %: 57 % (ref 43–77)
Platelets: 247 10*3/uL (ref 150–400)
RDW: 13.2 % (ref 11.5–15.5)

## 2012-09-26 LAB — COMPLETE METABOLIC PANEL WITH GFR
ALT: 16 U/L (ref 0–53)
Albumin: 4.3 g/dL (ref 3.5–5.2)
CO2: 23 mEq/L (ref 19–32)
Calcium: 9 mg/dL (ref 8.4–10.5)
Chloride: 107 mEq/L (ref 96–112)
GFR, Est African American: >89 mL/min
Glucose, Bld: 82 mg/dL (ref 70–99)
Sodium: 138 mEq/L (ref 135–145)
Total Protein: 7 g/dL (ref 6.0–8.3)

## 2012-09-26 LAB — URINALYSIS, COMPLETE
Glucose, UA: NEGATIVE mg/dL
Leukocytes, UA: NEGATIVE
Protein, ur: NEGATIVE mg/dL
Squamous Epithelial / LPF: NONE SEEN

## 2012-09-26 LAB — LIPID PANEL: Total CHOL/HDL Ratio: 4.2 Ratio

## 2012-09-26 LAB — TSH: TSH: 2.778 u[IU]/mL (ref 0.350–4.500)

## 2012-12-04 ENCOUNTER — Ambulatory Visit: Payer: No Typology Code available for payment source | Attending: Internal Medicine

## 2012-12-04 VITALS — BP 132/92 | HR 69 | Temp 98.8°F | Resp 16

## 2012-12-04 DIAGNOSIS — H538 Other visual disturbances: Secondary | ICD-10-CM

## 2012-12-04 NOTE — Progress Notes (Unsigned)
Pt assesed and told to get Zaditor OTC. If not working in 2 dys return to clinic

## 2012-12-04 NOTE — Progress Notes (Unsigned)
Pt here as walk in with c/o itchy watery eyes with L eye redness,slight yellow crust Denies fever,chills,n,v Sx started last Wednesday. otc meds not working

## 2012-12-08 ENCOUNTER — Ambulatory Visit: Payer: No Typology Code available for payment source | Attending: Internal Medicine

## 2012-12-08 VITALS — BP 103/65 | HR 74 | Temp 98.6°F | Resp 16

## 2012-12-08 DIAGNOSIS — Z23 Encounter for immunization: Secondary | ICD-10-CM

## 2012-12-08 MED ORDER — OFLOXACIN 0.3 % OP SOLN: 1.0000 [drp] | Freq: Four times a day (QID) | OPHTHALMIC | Status: DC

## 2012-12-08 NOTE — Progress Notes (Unsigned)
Pt returns today with increased eye itching,watery and yellow d/c. Denies blurred vision or pain Prescribed Ofloxacin 0.3mg /ml drops Flu vaccine given

## 2012-12-12 ENCOUNTER — Ambulatory Visit: Payer: No Typology Code available for payment source | Attending: Internal Medicine | Admitting: Internal Medicine

## 2012-12-12 ENCOUNTER — Encounter: Payer: Self-pay | Admitting: Internal Medicine

## 2012-12-12 VITALS — BP 106/68 | HR 68 | Temp 98.9°F | Resp 18 | Wt 220.0 lb

## 2012-12-12 DIAGNOSIS — B309 Viral conjunctivitis, unspecified: Secondary | ICD-10-CM

## 2012-12-12 DIAGNOSIS — H109 Unspecified conjunctivitis: Secondary | ICD-10-CM | POA: Insufficient documentation

## 2012-12-12 NOTE — Progress Notes (Signed)
Patient ID: Isaac Ellison, male   DOB: 1965/06/16, 47 y.o.   MRN: [id number] Patient Demographics  Isaac Ellison, is a 47 y.o. male  WUJ:[id number]  [id number]  DOB - 09-07-1965  Chief Complaint  Patient presents with  . Eye Problem        Subjective:   Isaac Ellison is a 47 y.o. male here today for a follow up visit. Patient was seen recently for viral conjunctivitis of the left eye, that has resolved but now the right eye is involved, red and painful, watery, sticky in the morning. As a contact with someone with the same conjunctivitis at work prior to the first symptom. Patient is currently using ofloxacin ophthalmic solution.  Patient has No headache, No chest pain, No abdominal pain - No Nausea, No new weakness tingling or numbness, No Cough - SOB.  ALLERGIES: No Known Allergies  PAST MEDICAL HISTORY: History reviewed. No pertinent past medical history.  MEDICATIONS AT HOME: Prior to Admission medications   Medication Sig Start Date End Date Taking? Authorizing Provider  ofloxacin (OCUFLOX) 0.3 % ophthalmic solution Place 1 drop into both eyes 4 (four) times daily. 12/08/12  Yes Jeanann Lewandowsky, MD  carbamide peroxide (EAR WAX REMOVAL DROPS) 6.5 % otic solution Place 5 drops into both ears 2 (two) times daily. 09/25/12   Jeanann Lewandowsky, MD  famotidine (PEPCID) 20 MG tablet Take 1 tablet (20 mg total) by mouth 2 (two) times daily. 08/29/12   Sunnie Nielsen, MD     Objective:   Filed Vitals:   12/12/12 0925  BP: 106/68  Pulse: 68  Temp: 98.9 F (37.2 C)  TempSrc: Oral  Resp: 18  Weight: 220 lb (99.791 kg)  SpO2: 97%    Exam General appearance : Awake, alert, not in any distress. Speech Clear. Not toxic looking HEENT: Atraumatic and Normocephalic, obvious right and pink eye, watery, pupils equally reactive to light and accomodation Neck: supple, no JVD. No cervical lymphadenopathy.  Chest:Good air entry bilaterally, no added sounds   CVS: S1 S2 regular, no murmurs.  Abdomen: Bowel sounds present, Non tender and not distended with no gaurding, rigidity or rebound. Extremities: B/L Lower Ext shows no edema, both legs are warm to touch Neurology: Awake alert, and oriented X 3, CN II-XII intact, Non focal Skin:No Rash Wounds:N/A   Data Review   CBC No results found for this basename: WBC, HGB, HCT, PLT, MCV, MCH, MCHC, RDW, NEUTRABS, LYMPHSABS, MONOABS, EOSABS, BASOSABS, BANDABS, BANDSABD,  in the last 168 hours  Chemistries   No results found for this basename: NA, K, CL, CO2, GLUCOSE, BUN, CREATININE, GFRCGP, CALCIUM, MG, AST, ALT, ALKPHOS, BILITOT,  in the last 168 hours ------------------------------------------------------------------------------------------------------------------ No results found for this basename: HGBA1C,  in the last 72 hours ------------------------------------------------------------------------------------------------------------------ No results found for this basename: CHOL, HDL, LDLCALC, TRIG, CHOLHDL, LDLDIRECT,  in the last 72 hours ------------------------------------------------------------------------------------------------------------------ No results found for this basename: TSH, T4TOTAL, FREET3, T3FREE, THYROIDAB,  in the last 72 hours ------------------------------------------------------------------------------------------------------------------ No results found for this basename: VITAMINB12, FOLATE, FERRITIN, TIBC, IRON, RETICCTPCT,  in the last 72 hours  Coagulation profile  No results found for this basename: INR, PROTIME,  in the last 168 hours    Assessment & Plan   Patient Active Problem List   Diagnosis Date Noted  . Viral conjunctivitis 12/12/2012  . Dental caries 09/25/2012  . Routine history and physical examination of adult 09/25/2012  . Chest pain 08/31/2012  . Anxiety 08/31/2012  . Annual physical exam 08/23/2012  Plan: Continue oxacillin  ophthalmic solution Patient counseled and educated about pinkeye and its course especially when caused by viral infection Patient given instruction to return in 2 days if there is no improvement Patient was excused from duties for the next 4 days  Patient was counseled about exercise and nutrition  Follow up in 2 days if no improvement otherwise we'll see in 3 months or when necessary  The patient was given clear instructions to go to ER or return to medical center if symptoms don't improve, worsen or new problems develop. The patient verbalized understanding. The patient was told to call to get lab results if they haven't heard anything in the next week.    Jeanann Lewandowsky, MD, MHA, FACP, FAAP [facility] [place], [place] [phone]   12/12/2012, 9:56 AM

## 2012-12-12 NOTE — Progress Notes (Signed)
Pt is here for persistent redness, itchiness and watery of right eye Seen here on 11/3 and 11/7 for the same problem... Given Ofloxacin w/no relief States left eye has gotten better but right eye is worse.  Sxs include: crusty eyes in the am, blurry vision and puffiness around eye.  Denies: pain, inj/trauma, f/v/n/d... 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

## 2012-12-12 NOTE — Patient Instructions (Signed)
Viral Conjunctivitis Conjunctivitis is an irritation (inflammation) of the clear membrane that covers the white part of the eye (the conjunctiva). The irritation can also happen on the underside of the eyelids. Conjunctivitis makes the eye red or pink in color. This is what is commonly known as pink eye. Viral conjunctivitis can spread easily (contagious). CAUSES   Infection from virus on the surface of the eye.  Infection from the irritation or injury of nearby tissues such as the eyelids or cornea.  More serious inflammation or infection on the inside of the eye.  Other eye diseases.  The use of certain eye medications. SYMPTOMS  The normally white color of the eye or the underside of the eyelid is usually pink or red in color. The pink eye is usually associated with irritation, tearing and some sensitivity to light. Viral conjunctivitis is often associated with a clear, watery discharge. If a discharge is present, there may also be some blurred vision in the affected eye. DIAGNOSIS  Conjunctivitis is diagnosed by an eye exam. The eye specialist looks for changes in the surface tissues of the eye which take on changes characteristic of the specific types of conjunctivitis. A sample of any discharge may be collected on a Q-Tip (sterile swap). The sample will be sent to a lab to see whether or not the inflammation is caused by bacterial or viral infection. TREATMENT  Viral conjunctivitis will not respond to medicines that kill germs (antibiotics). Treatment is aimed at stopping a bacterial infection on top of the viral infection. The goal of treatment is to relieve symptoms (such as itching) with antihistamine drops or other eye medications.  HOME CARE INSTRUCTIONS   To ease discomfort, apply a cool, clean wash cloth to your eye for 10 to 20 minutes, 3 to 4 times a day.  Gently wipe away any drainage from the eye with a warm, wet washcloth or a cotton ball.  Wash your hands often with soap  and use paper towels to dry.  Do not share towels or washcloths. This may spread the infection.  Change or wash your pillowcase every day.  You should not use eye make-up until the infection is gone.  Stop using contacts lenses. Ask your eye professional how to sterilize or replace them before using again. This depends on the type of contact lenses used.  Do not touch the edge of the eyelid with the eye drop bottle or ointment tube when applying medications to the affected eye. This will stop you from spreading the infection to the other eye or to others. SEEK IMMEDIATE MEDICAL CARE IF:   The infection has not improved within 3 days of beginning treatment.  A watery discharge from the eye develops.  Pain in the eye increases.  The redness is spreading.  Vision becomes blurred.  An oral temperature above 102 F (38.9 C) develops, or as your caregiver suggests.  Facial pain, redness or swelling develops.  Any problems that may be related to the prescribed medicine develop. MAKE SURE YOU:   Understand these instructions.  Will watch your condition.  Will get help right away if you are not doing well or get worse. Document Released: 01/18/2005 Document Revised: 04/12/2011 Document Reviewed: 09/07/2007 [facility] Patient Information 2014 [place], [place]. Conjuntivitis (Conjunctivitis) Usted padece conjuntivitis. La conjuntivitis se conoce frecuentemente como "ojo rojo". Las causas de la conjuntivitis pueden ser las infecciones virales o [place], Environmental consultant o lesiones. Los sntomas son: enrojecimiento de la superficie del ojo, [place], [place] y en  algunos casos, secreciones. La secrecin se deposita en las pestaas. Las infecciones virales causan una secrecin acuosa, mientras que las infecciones bacterianas causan una secrecin amarillenta y espesa. La conjuntivitis es muy contagiosa y se disemina por el contacto directo. [employer] parte del tratamiento le indicaran gotas oftlmicas  con antibiticos. Antes de [employer], retire todas la secreciones del ojo, lavndolo suavemente con agua tibia y algodn. Contine con el uso del medicamento hasta que se haya [employer] sin secrecin ocular. No se frote los ojos. Esto hace que aumente la irritacin y favorece la extensin de la infeccin. No utilice las [employer] miembros de [place]. Lvese las manos con agua y [place] antes y despus de tocarse los ojos. Utilice compresas fras para reducir Chief Technology Officer y anteojos de sol para disminuir la irritacin que ocasiona la luz. No debe usarse maquillaje ni lentes de contacto hasta que la infeccin haya desaparecido. SOLICITE ATENCIN MDICA SI:  Sus sntomas no mejoran luego de [address]  [address] de [place].  Aumenta el dolor o las dificultades para ver.  La zona externa de los prpados est muy roja o hinchada. Document Released: 01/18/2005 Document Revised: 04/12/2011 [facility] Patient Information 2014 [place], [place].

## 2012-12-14 ENCOUNTER — Encounter: Payer: Self-pay | Admitting: Internal Medicine

## 2012-12-14 ENCOUNTER — Ambulatory Visit: Payer: No Typology Code available for payment source | Attending: Internal Medicine | Admitting: Internal Medicine

## 2012-12-14 VITALS — BP 125/80 | HR 62 | Temp 98.4°F | Resp 16 | Ht 70.0 in | Wt 223.0 lb

## 2012-12-14 DIAGNOSIS — B309 Viral conjunctivitis, unspecified: Secondary | ICD-10-CM

## 2012-12-14 DIAGNOSIS — H109 Unspecified conjunctivitis: Secondary | ICD-10-CM | POA: Insufficient documentation

## 2012-12-14 MED ORDER — OFLOXACIN 0.3 % OP SOLN: 1.0000 [drp] | Freq: Four times a day (QID) | OPHTHALMIC | Status: DC

## 2012-12-14 NOTE — Progress Notes (Signed)
Pt is still having itchiness and redness in his eyes. The medication is not helping.

## 2012-12-14 NOTE — Progress Notes (Signed)
Patient ID: Isaac Ellison, male   DOB: 1965/12/26, 47 y.o.   MRN: [id number] Patient Demographics  Isaac Ellison, is a 47 y.o. male  [id number]  [id number]  DOB - 09/17/1965  Chief Complaint  Patient presents with  . Follow-up        Subjective:   Isaac Ellison is a 47 y.o. male here today for a follow up visit. Patient hasn't viral conjunctivitis, here for followup. Right red eye almost resolved, significant improvement noticed. Patient thinks he still needs the eye drop. Patient has No headache, No chest pain, No abdominal pain - No Nausea, No new weakness tingling or numbness, No Cough - SOB.  ALLERGIES: No Known Allergies  PAST MEDICAL HISTORY: History reviewed. No pertinent past medical history.  MEDICATIONS AT HOME: Prior to Admission medications   Medication Sig Start Date End Date Taking? Authorizing Provider  ofloxacin (OCUFLOX) 0.3 % ophthalmic solution Place 1 drop into both eyes 4 (four) times daily. 12/14/12  Yes Jeanann Lewandowsky, MD  carbamide peroxide (EAR WAX REMOVAL DROPS) 6.5 % otic solution Place 5 drops into both ears 2 (two) times daily. 09/25/12   Jeanann Lewandowsky, MD  famotidine (PEPCID) 20 MG tablet Take 1 tablet (20 mg total) by mouth 2 (two) times daily. 08/29/12   Sunnie Nielsen, MD     Objective:   Filed Vitals:   12/14/12 1215  BP: 125/80  Pulse: 62  Temp: 98.4 F (36.9 C)  TempSrc: Oral  Resp: 16  Height: 5\' 10"  (1.778 m)  Weight: 223 lb (101.152 kg)  SpO2: 99%    Exam General appearance : Awake, alert, not in any distress. Speech Clear. Not toxic looking HEENT: Atraumatic and Normocephalic, pupils equally reactive to light and accomodation, right red eye much improved Neck: supple, no JVD. No cervical lymphadenopathy.  Chest:Good air entry bilaterally, no added sounds  CVS: S1 S2 regular, no murmurs.  Abdomen: Bowel sounds present, Non tender and not distended with no gaurding, rigidity or  rebound. Extremities: B/L Lower Ext shows no edema, both legs are warm to touch Neurology: Awake alert, and oriented X 3, CN II-XII intact, Non focal Skin:No Rash Wounds:N/A   Data Review   CBC No results found for this basename: WBC, HGB, HCT, PLT, MCV, MCH, MCHC, RDW, NEUTRABS, LYMPHSABS, MONOABS, EOSABS, BASOSABS, BANDABS, BANDSABD,  in the last 168 hours  Chemistries   No results found for this basename: NA, K, CL, CO2, GLUCOSE, BUN, CREATININE, GFRCGP, CALCIUM, MG, AST, ALT, ALKPHOS, BILITOT,  in the last 168 hours ------------------------------------------------------------------------------------------------------------------ No results found for this basename: HGBA1C,  in the last 72 hours ------------------------------------------------------------------------------------------------------------------ No results found for this basename: CHOL, HDL, LDLCALC, TRIG, CHOLHDL, LDLDIRECT,  in the last 72 hours ------------------------------------------------------------------------------------------------------------------ No results found for this basename: TSH, T4TOTAL, FREET3, T3FREE, THYROIDAB,  in the last 72 hours ------------------------------------------------------------------------------------------------------------------ No results found for this basename: VITAMINB12, FOLATE, FERRITIN, TIBC, IRON, RETICCTPCT,  in the last 72 hours  Coagulation profile  No results found for this basename: INR, PROTIME,  in the last 168 hours    Assessment & Plan   Patient Active Problem List   Diagnosis Date Noted  . Viral conjunctivitis 12/12/2012  . Dental caries 09/25/2012  . Routine history and physical examination of adult 09/25/2012  . Chest pain 08/31/2012  . Anxiety 08/31/2012  . Annual physical exam 08/23/2012     Plan: Continue Ofloxacin 0.3% ophthalmic solution  Patient counseled extensively about diagnosis and the course Expect resolution of symptoms in 1-2  weeks  Follow up in 3 months or when necessary   The patient was given clear instructions to go to ER or return to medical center if symptoms don't improve, worsen or new problems develop. The patient verbalized understanding. The patient was told to call to get lab results if they haven't heard anything in the next week.    Jeanann Lewandowsky, MD, MHA, FACP, FAAP [facility] [place], [place] [phone]   12/14/2012, 12:39 PM

## 2012-12-19 ENCOUNTER — Ambulatory Visit: Payer: No Typology Code available for payment source

## 2013-03-19 ENCOUNTER — Ambulatory Visit: Payer: No Typology Code available for payment source | Admitting: Internal Medicine

## 2014-04-07 ENCOUNTER — Encounter (HOSPITAL_COMMUNITY): Payer: Self-pay | Admitting: *Deleted

## 2014-04-07 ENCOUNTER — Emergency Department (HOSPITAL_COMMUNITY): Payer: Self-pay

## 2014-04-07 ENCOUNTER — Emergency Department (HOSPITAL_COMMUNITY)
Admission: EM | Admit: 2014-04-07 | Discharge: 2014-04-07 | Disposition: A | Discharge status: Home / Self Care | Payer: Self-pay | Attending: Emergency Medicine | Admitting: Emergency Medicine

## 2014-04-07 DIAGNOSIS — R519 Headache, unspecified: Secondary | ICD-10-CM

## 2014-04-07 DIAGNOSIS — Z87891 Personal history of nicotine dependence: Secondary | ICD-10-CM | POA: Insufficient documentation

## 2014-04-07 DIAGNOSIS — H9201 Otalgia, right ear: Secondary | ICD-10-CM | POA: Insufficient documentation

## 2014-04-07 DIAGNOSIS — R51 Headache: Secondary | ICD-10-CM | POA: Insufficient documentation

## 2014-04-07 DIAGNOSIS — M542 Cervicalgia: Secondary | ICD-10-CM | POA: Insufficient documentation

## 2014-04-07 DIAGNOSIS — Z791 Long term (current) use of non-steroidal anti-inflammatories (NSAID): Secondary | ICD-10-CM | POA: Insufficient documentation

## 2014-04-07 DIAGNOSIS — H6122 Impacted cerumen, left ear: Secondary | ICD-10-CM | POA: Insufficient documentation

## 2014-04-07 MED ORDER — METOCLOPRAMIDE HCL 5 MG/ML IJ SOLN: 10.0000 mg | Freq: Once | INTRAMUSCULAR | Status: DC

## 2014-04-07 MED ORDER — DIPHENHYDRAMINE HCL 50 MG/ML IJ SOLN: 25.0000 mg | Freq: Once | INTRAMUSCULAR | Status: DC

## 2014-04-07 MED ORDER — KETOROLAC TROMETHAMINE 60 MG/2ML IM SOLN
60.0000 mg | Freq: Once | INTRAMUSCULAR | Status: AC
  Administered 2014-04-07: 60 mg via INTRAMUSCULAR
  Filled 2014-04-07: qty 2

## 2014-04-07 MED ORDER — METOCLOPRAMIDE HCL 10 MG PO TABS
10.0000 mg | ORAL_TABLET | Freq: Once | ORAL | Status: AC
  Administered 2014-04-07: 10 mg via ORAL
  Filled 2014-04-07 (×2): qty 1

## 2014-04-07 MED ORDER — DIPHENHYDRAMINE HCL 25 MG PO CAPS
25.0000 mg | ORAL_CAPSULE | Freq: Once | ORAL | Status: AC
  Administered 2014-04-07: 25 mg via ORAL
  Filled 2014-04-07: qty 1

## 2014-04-07 NOTE — ED Notes (Signed)
Pt reports having right side headache x 3 days. Will be temporarily relieved with aleve but then pain returns. Denies n/v.

## 2014-04-07 NOTE — ED Notes (Signed)
Patient transported to CT 
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
## 2015-10-31 ENCOUNTER — Emergency Department (HOSPITAL_COMMUNITY): Payer: Medicaid Other | Admitting: Certified Registered"

## 2015-10-31 ENCOUNTER — Inpatient Hospital Stay (HOSPITAL_COMMUNITY)
Admission: EM | Admit: 2015-10-31 | Discharge: 2015-11-02 | DRG: 481 | Disposition: A | Discharge status: Home / Self Care | Payer: Medicaid Other | Attending: Orthopaedic Surgery | Admitting: Orthopaedic Surgery

## 2015-10-31 ENCOUNTER — Encounter (HOSPITAL_COMMUNITY): Payer: Self-pay

## 2015-10-31 ENCOUNTER — Emergency Department (HOSPITAL_COMMUNITY): Payer: Medicaid Other

## 2015-10-31 ENCOUNTER — Encounter (HOSPITAL_COMMUNITY)
Admission: EM | Disposition: A | Discharge status: Home / Self Care | Payer: Self-pay | Source: Home / Self Care | Attending: Orthopaedic Surgery

## 2015-10-31 DIAGNOSIS — E669 Obesity, unspecified: Secondary | ICD-10-CM | POA: Diagnosis present

## 2015-10-31 DIAGNOSIS — Z91018 Allergy to other foods: Secondary | ICD-10-CM | POA: Diagnosis not present

## 2015-10-31 DIAGNOSIS — Z91048 Other nonmedicinal substance allergy status: Secondary | ICD-10-CM

## 2015-10-31 DIAGNOSIS — Z6841 Body Mass Index (BMI) 40.0 and over, adult: Secondary | ICD-10-CM | POA: Diagnosis not present

## 2015-10-31 DIAGNOSIS — M79604 Pain in right leg: Secondary | ICD-10-CM | POA: Diagnosis present

## 2015-10-31 DIAGNOSIS — Y249XXA Unspecified firearm discharge, undetermined intent, initial encounter: Secondary | ICD-10-CM

## 2015-10-31 DIAGNOSIS — S72301A Unspecified fracture of shaft of right femur, initial encounter for closed fracture: Secondary | ICD-10-CM

## 2015-10-31 DIAGNOSIS — S72351B Displaced comminuted fracture of shaft of right femur, initial encounter for open fracture type I or II: Principal | ICD-10-CM | POA: Diagnosis present

## 2015-10-31 DIAGNOSIS — S7291XN Unspecified fracture of right femur, subsequent encounter for open fracture type IIIA, IIIB, or IIIC with nonunion: Secondary | ICD-10-CM

## 2015-10-31 DIAGNOSIS — R52 Pain, unspecified: Secondary | ICD-10-CM

## 2015-10-31 HISTORY — PX: FEMUR IM NAIL: SHX1597

## 2015-10-31 LAB — CBC
HEMATOCRIT: 43.4 % (ref 39.0–52.0)
HEMOGLOBIN: 14.7 g/dL (ref 13.0–17.0)
MCH: 30.3 pg (ref 26.0–34.0)
MCHC: 33.9 g/dL (ref 30.0–36.0)
MCV: 89.5 fL (ref 78.0–100.0)
Platelets: 267 10*3/uL (ref 150–400)
RBC: 4.85 MIL/uL (ref 4.22–5.81)
RDW: 12.4 % (ref 11.5–15.5)
WBC: 13 10*3/uL — AB (ref 4.0–10.5)

## 2015-10-31 LAB — I-STAT CHEM 8, ED
BUN: 21 mg/dL — AB (ref 6–20)
CALCIUM ION: 1.07 mmol/L — AB (ref 1.15–1.40)
CREATININE: 1.1 mg/dL (ref 0.61–1.24)
Chloride: 105 mmol/L (ref 101–111)
Glucose, Bld: 110 mg/dL — ABNORMAL HIGH (ref 65–99)
HEMATOCRIT: 45 % (ref 39.0–52.0)
HEMOGLOBIN: 15.3 g/dL (ref 13.0–17.0)
Potassium: 3.4 mmol/L — ABNORMAL LOW (ref 3.5–5.1)
SODIUM: 141 mmol/L (ref 135–145)
TCO2: 22 mmol/L (ref 0–100)

## 2015-10-31 LAB — ETHANOL: Alcohol, Ethyl (B): <5 mg/dL (ref <5)

## 2015-10-31 LAB — PREPARE FRESH FROZEN PLASMA
Unit division: 0
Unit division: 0

## 2015-10-31 LAB — COMPREHENSIVE METABOLIC PANEL
ALT: 22 U/L (ref 17–63)
ANION GAP: 8 (ref 5–15)
AST: 26 U/L (ref 15–41)
Albumin: 3.6 g/dL (ref 3.5–5.0)
Alkaline Phosphatase: 67 U/L (ref 38–126)
BILIRUBIN TOTAL: 0.3 mg/dL (ref 0.3–1.2)
BUN: 18 mg/dL (ref 6–20)
CHLORIDE: 105 mmol/L (ref 101–111)
CO2: 22 mmol/L (ref 22–32)
Calcium: 8 mg/dL — ABNORMAL LOW (ref 8.9–10.3)
Creatinine, Ser: 1.12 mg/dL (ref 0.61–1.24)
GFR calc Af Amer: >60 mL/min (ref >60)
Glucose, Bld: 108 mg/dL — ABNORMAL HIGH (ref 65–99)
POTASSIUM: 3.2 mmol/L — AB (ref 3.5–5.1)
Sodium: 135 mmol/L (ref 135–145)
TOTAL PROTEIN: 6.5 g/dL (ref 6.5–8.1)

## 2015-10-31 LAB — TYPE AND SCREEN
ABO/RH(D): A POS
Antibody Screen: NEGATIVE
UNIT DIVISION: 0
Unit division: 0

## 2015-10-31 LAB — ABO/RH: ABO/RH(D): A POS

## 2015-10-31 LAB — I-STAT CG4 LACTIC ACID, ED: LACTIC ACID, VENOUS: 2.44 mmol/L — AB (ref 0.5–1.9)

## 2015-10-31 LAB — PROTIME-INR
INR: 0.95
PROTHROMBIN TIME: 12.6 s (ref 11.4–15.2)

## 2015-10-31 SURGERY — INSERTION, INTRAMEDULLARY ROD, FEMUR, RETROGRADE
Anesthesia: General | Site: Leg Upper | Laterality: Right

## 2015-10-31 MED ORDER — ONDANSETRON HCL 4 MG/2ML IJ SOLN
INTRAMUSCULAR | Status: DC | PRN
  Administered 2015-10-31: 4 mg via INTRAVENOUS

## 2015-10-31 MED ORDER — HYDROMORPHONE HCL 1 MG/ML IJ SOLN
1.0000 mg | Freq: Once | INTRAMUSCULAR | Status: AC
  Administered 2015-10-31: 1 mg via INTRAVENOUS
  Filled 2015-10-31: qty 1

## 2015-10-31 MED ORDER — PROMETHAZINE HCL 25 MG/ML IJ SOLN: 6.2500 mg | INTRAMUSCULAR | Status: DC | PRN

## 2015-10-31 MED ORDER — HYDROMORPHONE HCL 1 MG/ML IJ SOLN
INTRAMUSCULAR | Status: AC
  Filled 2015-10-31: qty 1

## 2015-10-31 MED ORDER — LIDOCAINE 2% (20 MG/ML) 5 ML SYRINGE
INTRAMUSCULAR | Status: AC
Start: 2015-10-31 — End: 2015-10-31
  Filled 2015-10-31: qty 5

## 2015-10-31 MED ORDER — SUCCINYLCHOLINE CHLORIDE 20 MG/ML IJ SOLN
INTRAMUSCULAR | Status: DC | PRN
  Administered 2015-10-31: 120 mg via INTRAVENOUS

## 2015-10-31 MED ORDER — LACTATED RINGERS IV SOLN: INTRAVENOUS | Status: DC

## 2015-10-31 MED ORDER — PROPOFOL 10 MG/ML IV BOLUS
INTRAVENOUS | Status: AC
  Filled 2015-10-31: qty 20

## 2015-10-31 MED ORDER — CEFAZOLIN SODIUM-DEXTROSE 2-3 GM-% IV SOLR
INTRAVENOUS | Status: DC | PRN
  Administered 2015-10-31: 2 g via INTRAVENOUS

## 2015-10-31 MED ORDER — HYDROMORPHONE HCL 1 MG/ML IJ SOLN
0.2500 mg | INTRAMUSCULAR | Status: DC | PRN
Start: 2015-10-31 — End: 2015-11-01
  Administered 2015-10-31 – 2015-11-01 (×4): 0.5 mg via INTRAVENOUS

## 2015-10-31 MED ORDER — FENTANYL CITRATE (PF) 100 MCG/2ML IJ SOLN
INTRAMUSCULAR | Status: AC
  Filled 2015-10-31: qty 4

## 2015-10-31 MED ORDER — FENTANYL CITRATE (PF) 100 MCG/2ML IJ SOLN
100.0000 ug | Freq: Once | INTRAMUSCULAR | Status: AC
  Administered 2015-10-31: 100 ug via INTRAVENOUS
  Filled 2015-10-31: qty 2

## 2015-10-31 MED ORDER — MIDAZOLAM HCL 2 MG/2ML IJ SOLN
INTRAMUSCULAR | Status: AC
  Filled 2015-10-31: qty 2

## 2015-10-31 MED ORDER — LIDOCAINE HCL (CARDIAC) 20 MG/ML IV SOLN
INTRAVENOUS | Status: DC | PRN
  Administered 2015-10-31: 60 mg via INTRAVENOUS

## 2015-10-31 MED ORDER — MEPERIDINE HCL 25 MG/ML IJ SOLN: 6.2500 mg | INTRAMUSCULAR | Status: DC | PRN

## 2015-10-31 MED ORDER — FENTANYL CITRATE (PF) 100 MCG/2ML IJ SOLN
100.0000 ug | INTRAMUSCULAR | Status: DC | PRN
Start: 2015-10-31 — End: 2015-11-01

## 2015-10-31 MED ORDER — LACTATED RINGERS IV SOLN
INTRAVENOUS | Status: DC | PRN
  Administered 2015-10-31 (×2): via INTRAVENOUS

## 2015-10-31 MED ORDER — PROPOFOL 10 MG/ML IV BOLUS
INTRAVENOUS | Status: DC | PRN
  Administered 2015-10-31: 140 mg via INTRAVENOUS

## 2015-10-31 MED ORDER — TETANUS-DIPHTH-ACELL PERTUSSIS 5-2.5-18.5 LF-MCG/0.5 IM SUSP
0.5000 mL | Freq: Once | INTRAMUSCULAR | Status: AC
  Administered 2015-10-31: 0.5 mL via INTRAMUSCULAR
  Filled 2015-10-31: qty 0.5

## 2015-10-31 MED ORDER — SUCCINYLCHOLINE CHLORIDE 200 MG/10ML IV SOSY
PREFILLED_SYRINGE | INTRAVENOUS | Status: AC
  Filled 2015-10-31: qty 10

## 2015-10-31 MED ORDER — TETANUS-DIPHTHERIA TOXOIDS TD 5-2 LFU IM INJ: 0.5000 mL | INJECTION | Freq: Once | INTRAMUSCULAR | Status: DC

## 2015-10-31 MED ORDER — FENTANYL CITRATE (PF) 100 MCG/2ML IJ SOLN
INTRAMUSCULAR | Status: DC | PRN
Start: 2015-10-31 — End: 2015-10-31
  Administered 2015-10-31: 50 ug via INTRAVENOUS
  Administered 2015-10-31: 100 ug via INTRAVENOUS
  Administered 2015-10-31: 50 ug via INTRAVENOUS

## 2015-10-31 MED ORDER — ONDANSETRON HCL 4 MG/2ML IJ SOLN
INTRAMUSCULAR | Status: AC
  Filled 2015-10-31: qty 2

## 2015-10-31 MED ORDER — MIDAZOLAM HCL 5 MG/5ML IJ SOLN
INTRAMUSCULAR | Status: DC | PRN
  Administered 2015-10-31: 2 mg via INTRAVENOUS

## 2015-10-31 MED ORDER — 0.9 % SODIUM CHLORIDE (POUR BTL) OPTIME
TOPICAL | Status: DC | PRN
  Administered 2015-10-31: 1000 mL

## 2015-10-31 MED ORDER — CEFAZOLIN IN D5W 1 GM/50ML IV SOLN
1.0000 g | Freq: Once | INTRAVENOUS | Status: AC
  Administered 2015-10-31: 1 g via INTRAVENOUS
  Filled 2015-10-31: qty 50

## 2015-10-31 MED ORDER — BUPIVACAINE-EPINEPHRINE (PF) 0.5% -1:200000 IJ SOLN
INTRAMUSCULAR | Status: AC
  Filled 2015-10-31: qty 30

## 2015-10-31 SURGICAL SUPPLY — 75 items
BANDAGE ACE 4X5 VEL STRL LF (GAUZE/BANDAGES/DRESSINGS) ×3 IMPLANT
BANDAGE ACE 6X5 VEL STRL LF (GAUZE/BANDAGES/DRESSINGS) ×3 IMPLANT
BANDAGE ELASTIC 6 VELCRO ST LF (GAUZE/BANDAGES/DRESSINGS) ×3 IMPLANT
BANDAGE ESMARK 6X9 LF (GAUZE/BANDAGES/DRESSINGS) IMPLANT
BIT DRILL CALIBRATED 4.3MMX365 (DRILL) ×1 IMPLANT
BIT DRILL CROWE PNT TWST 4.5MM (DRILL) ×1 IMPLANT
BLADE SURG 15 STRL LF DISP TIS (BLADE) ×1 IMPLANT
BLADE SURG 15 STRL SS (BLADE) ×2
BLADE SURG ROTATE 9660 (MISCELLANEOUS) IMPLANT
BNDG COHESIVE 6X5 TAN STRL LF (GAUZE/BANDAGES/DRESSINGS) ×3 IMPLANT
BNDG ESMARK 6X9 LF (GAUZE/BANDAGES/DRESSINGS)
BNDG GAUZE ELAST 4 BULKY (GAUZE/BANDAGES/DRESSINGS) ×3 IMPLANT
COVER SURGICAL LIGHT HANDLE (MISCELLANEOUS) ×3 IMPLANT
CUFF TOURNIQUET SINGLE 34IN LL (TOURNIQUET CUFF) IMPLANT
CUFF TOURNIQUET SINGLE 44IN (TOURNIQUET CUFF) IMPLANT
DRAPE C-ARM 42X72 X-RAY (DRAPES) ×3 IMPLANT
DRAPE C-ARMOR (DRAPES) ×3 IMPLANT
DRAPE IMP U-DRAPE 54X76 (DRAPES) ×3 IMPLANT
DRAPE INCISE IOBAN 66X45 STRL (DRAPES) ×3 IMPLANT
DRAPE ORTHO SPLIT 77X108 STRL (DRAPES) ×4
DRAPE PROXIMA HALF (DRAPES) ×6 IMPLANT
DRAPE SURG 17X23 STRL (DRAPES) ×3 IMPLANT
DRAPE SURG ORHT 6 SPLT 77X108 (DRAPES) ×2 IMPLANT
DRAPE U-SHAPE 47X51 STRL (DRAPES) ×3 IMPLANT
DRESSING OPSITE X SMALL 2X3 (GAUZE/BANDAGES/DRESSINGS) ×3 IMPLANT
DRILL CALIBRATED 4.3MMX365 (DRILL) ×3
DRILL CROWE POINT TWIST 4.5MM (DRILL) ×3
DRSG MEPILEX BORDER 4X4 (GAUZE/BANDAGES/DRESSINGS) ×3 IMPLANT
DURAPREP 26ML APPLICATOR (WOUND CARE) IMPLANT
ELECT REM PT RETURN 9FT ADLT (ELECTROSURGICAL) ×3
ELECTRODE REM PT RTRN 9FT ADLT (ELECTROSURGICAL) ×1 IMPLANT
GAUZE SPONGE 4X4 12PLY STRL (GAUZE/BANDAGES/DRESSINGS) ×6 IMPLANT
GAUZE XEROFORM 1X8 LF (GAUZE/BANDAGES/DRESSINGS) ×3 IMPLANT
GAUZE XEROFORM 5X9 LF (GAUZE/BANDAGES/DRESSINGS) ×3 IMPLANT
GLOVE BIO SURGEON STRL SZ 6.5 (GLOVE) ×4 IMPLANT
GLOVE BIO SURGEON STRL SZ8 (GLOVE) ×3 IMPLANT
GLOVE BIO SURGEONS STRL SZ 6.5 (GLOVE) ×2
GLOVE BIOGEL PI IND STRL 6.5 (GLOVE) ×2 IMPLANT
GLOVE BIOGEL PI IND STRL 8 (GLOVE) ×2 IMPLANT
GLOVE BIOGEL PI INDICATOR 6.5 (GLOVE) ×4
GLOVE BIOGEL PI INDICATOR 8 (GLOVE) ×4
GLOVE ORTHO TXT STRL SZ7.5 (GLOVE) ×3 IMPLANT
GLOVE SURG SS PI 6.5 STRL IVOR (GLOVE) ×6 IMPLANT
GOWN STRL REUS W/ TWL LRG LVL3 (GOWN DISPOSABLE) ×2 IMPLANT
GOWN STRL REUS W/ TWL XL LVL3 (GOWN DISPOSABLE) ×4 IMPLANT
GOWN STRL REUS W/TWL LRG LVL3 (GOWN DISPOSABLE) ×4
GOWN STRL REUS W/TWL XL LVL3 (GOWN DISPOSABLE) ×8
GUIDEPIN 3.2X17.5 THRD DISP (PIN) ×3 IMPLANT
GUIDEWIRE BEAD TIP (WIRE) ×6 IMPLANT
KIT BASIN OR (CUSTOM PROCEDURE TRAY) ×3 IMPLANT
KIT ROOM TURNOVER OR (KITS) ×3 IMPLANT
MANIFOLD NEPTUNE II (INSTRUMENTS) ×3 IMPLANT
NAIL FEM RETRO 12X360 (Nail) ×3 IMPLANT
NEEDLE 22X1 1/2 (OR ONLY) (NEEDLE) ×3 IMPLANT
NS IRRIG 1000ML POUR BTL (IV SOLUTION) ×3 IMPLANT
PACK GENERAL/GYN (CUSTOM PROCEDURE TRAY) ×3 IMPLANT
PACK UNIVERSAL I (CUSTOM PROCEDURE TRAY) ×3 IMPLANT
PAD ARMBOARD 7.5X6 YLW CONV (MISCELLANEOUS) ×6 IMPLANT
PAD CAST 4YDX4 CTTN HI CHSV (CAST SUPPLIES) ×1 IMPLANT
PADDING CAST COTTON 4X4 STRL (CAST SUPPLIES) ×2
SCREW CORT TI DBL LEAD 5X44 (Screw) ×3 IMPLANT
SCREW CORT TI DBL LEAD 5X65 (Screw) ×3 IMPLANT
SCREW CORT TI DBL LEAD 5X75 (Screw) ×3 IMPLANT
SCREW CORT TI DBL LEAD 5X90 (Screw) ×3 IMPLANT
SPONGE GAUZE 4X4 12PLY STER LF (GAUZE/BANDAGES/DRESSINGS) ×3 IMPLANT
STAPLER VISISTAT 35W (STAPLE) ×6 IMPLANT
STOCKINETTE IMPERVIOUS LG (DRAPES) ×3 IMPLANT
SUT ETHILON 2 0 FS 18 (SUTURE) ×3 IMPLANT
SUT VIC AB 0 CTB1 27 (SUTURE) ×3 IMPLANT
SUT VIC AB 2-0 CT1 27 (SUTURE) ×2
SUT VIC AB 2-0 CT1 TAPERPNT 27 (SUTURE) ×1 IMPLANT
SYR CONTROL 10ML LL (SYRINGE) ×3 IMPLANT
TOWEL OR 17X24 6PK STRL BLUE (TOWEL DISPOSABLE) ×3 IMPLANT
TOWEL OR 17X26 10 PK STRL BLUE (TOWEL DISPOSABLE) ×3 IMPLANT
WATER STERILE IRR 1000ML POUR (IV SOLUTION) ×3 IMPLANT

## 2015-10-31 NOTE — ED Notes (Signed)
Family at beside. Family given emotional support. 
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
## 2015-10-31 NOTE — ED Notes (Signed)
Dr. Mesner at bedside   
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
## 2015-10-31 NOTE — ED Notes (Signed)
Vital signs stable. 

## 2015-10-31 NOTE — Anesthesia Procedure Notes (Signed)
Procedure Name: Intubation Date/Time: 10/31/2015 10:02 PM Performed by: Arlice ColtMANESS, Delaney Perona B Pre-anesthesia Checklist: Patient identified, Emergency Drugs available, Suction available, Patient being monitored and Timeout performed Patient Re-evaluated:Patient Re-evaluated prior to inductionOxygen Delivery Method: Circle system utilized Preoxygenation: Pre-oxygenation with 100% oxygen Intubation Type: IV induction and Rapid sequence Laryngoscope Size: Mac and 3 Grade View: Grade I Tube type: Oral Tube size: 7.5 mm Number of attempts: 1 Airway Equipment and Method: Stylet Placement Confirmation: ETT inserted through vocal cords under direct vision,  positive ETCO2 and breath sounds checked- equal and bilateral Secured at: 22 cm Tube secured with: Tape Dental Injury: Teeth and Oropharynx as per pre-operative assessment

## 2015-10-31 NOTE — Transfer of Care (Signed)
Immediate Anesthesia Transfer of Care Note  Patient: Isaac Ellison  Procedure(s) Performed: Procedure(s): INTRAMEDULLARY (IM) RETROGRADE FEMORAL NAILING (Right)  Patient Location: PACU  Anesthesia Type:General  Level of Consciousness: awake, alert  and oriented  Airway & Oxygen Therapy: Patient Spontanous Breathing and Patient connected to nasal cannula oxygen  Post-op Assessment: Report given to RN and Post -op Vital signs reviewed and stable  Post vital signs: Reviewed and stable  Last Vitals:  Vitals:   10/31/15 2100 10/31/15 2115  BP: 129/81 127/88  Pulse: 75 76  Resp: 16 11  Temp:      Last Pain:  Vitals:   10/31/15 2127  TempSrc:   PainSc: 7          Complications: No apparent anesthesia complications
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
## 2015-10-31 NOTE — Anesthesia Preprocedure Evaluation (Addendum)
Anesthesia Evaluation  Patient identified by MRN, date of birth, ID band Patient awake    Reviewed: Allergy & Precautions, NPO status , Patient's Chart, lab work & pertinent test results  Airway Mallampati: I  TM Distance: >3 FB Neck ROM: Full    Dental  (+) Teeth Intact, Dental Advisory Given   Pulmonary neg pulmonary ROS,    breath sounds clear to auscultation       Cardiovascular negative cardio ROS   Rhythm:Regular Rate:Normal     Neuro/Psych negative neurological ROS  negative psych ROS   GI/Hepatic negative GI ROS, Neg liver ROS,   Endo/Other  negative endocrine ROS  Renal/GU negative Renal ROS  negative genitourinary   Musculoskeletal negative musculoskeletal ROS (+)   Abdominal (+) + obese,   Peds negative pediatric ROS (+)  Hematology negative hematology ROS (+)   Anesthesia Other Findings   Reproductive/Obstetrics negative OB ROS                            Anesthesia Physical Anesthesia Plan  ASA: III and emergent  Anesthesia Plan: General   Post-op Pain Management:    Induction: Intravenous  Airway Management Planned: Oral ETT  Additional Equipment:   Intra-op Plan:   Post-operative Plan: Extubation in OR  Informed Consent: I have reviewed the patients History and Physical, chart, labs and discussed the procedure including the risks, benefits and alternatives for the proposed anesthesia with the patient or authorized representative who has indicated his/her understanding and acceptance.   Dental advisory given  Plan Discussed with: CRNA  Anesthesia Plan Comments:         Anesthesia Quick Evaluation

## 2015-11-01 LAB — CDS SEROLOGY

## 2015-11-01 MED ORDER — OXYCODONE HCL 5 MG PO TABS
5.0000 mg | ORAL_TABLET | ORAL | Status: DC | PRN
  Administered 2015-11-01 – 2015-11-02 (×10): 10 mg via ORAL
  Filled 2015-11-01 (×11): qty 2

## 2015-11-01 MED ORDER — METOCLOPRAMIDE HCL 5 MG/ML IJ SOLN: 5.0000 mg | Freq: Three times a day (TID) | INTRAMUSCULAR | Status: DC | PRN

## 2015-11-01 MED ORDER — ONDANSETRON HCL 4 MG PO TABS: 4.0000 mg | ORAL_TABLET | Freq: Four times a day (QID) | ORAL | Status: DC | PRN

## 2015-11-01 MED ORDER — METHOCARBAMOL 1000 MG/10ML IJ SOLN: 500.0000 mg | Freq: Four times a day (QID) | INTRAVENOUS | Status: DC | PRN

## 2015-11-01 MED ORDER — ACETAMINOPHEN 650 MG RE SUPP
650.0000 mg | Freq: Four times a day (QID) | RECTAL | Status: DC | PRN
Start: 2015-11-01 — End: 2015-11-02

## 2015-11-01 MED ORDER — METOCLOPRAMIDE HCL 5 MG PO TABS: 5.0000 mg | ORAL_TABLET | Freq: Three times a day (TID) | ORAL | Status: DC | PRN

## 2015-11-01 MED ORDER — DIPHENHYDRAMINE HCL 12.5 MG/5ML PO ELIX: 12.5000 mg | ORAL_SOLUTION | ORAL | Status: DC | PRN

## 2015-11-01 MED ORDER — METHOCARBAMOL 500 MG PO TABS
500.0000 mg | ORAL_TABLET | Freq: Four times a day (QID) | ORAL | Status: DC | PRN
  Administered 2015-11-01 – 2015-11-02 (×5): 500 mg via ORAL
  Filled 2015-11-01 (×5): qty 1

## 2015-11-01 MED ORDER — SODIUM CHLORIDE 0.9 % IV SOLN
INTRAVENOUS | Status: DC
  Administered 2015-11-01: 06:00:00 via INTRAVENOUS

## 2015-11-01 MED ORDER — ONDANSETRON HCL 4 MG/2ML IJ SOLN: 4.0000 mg | Freq: Four times a day (QID) | INTRAMUSCULAR | Status: DC | PRN

## 2015-11-01 MED ORDER — HYDROMORPHONE HCL 1 MG/ML IJ SOLN
1.0000 mg | INTRAMUSCULAR | Status: DC | PRN
  Administered 2015-11-01: 1 mg via INTRAVENOUS
  Filled 2015-11-01: qty 1

## 2015-11-01 MED ORDER — ACETAMINOPHEN 325 MG PO TABS
650.0000 mg | ORAL_TABLET | Freq: Four times a day (QID) | ORAL | Status: DC | PRN
Start: 2015-11-01 — End: 2015-11-02
  Administered 2015-11-01: 650 mg via ORAL
  Filled 2015-11-01: qty 2

## 2015-11-01 MED ORDER — CEFAZOLIN IN D5W 1 GM/50ML IV SOLN
1.0000 g | Freq: Four times a day (QID) | INTRAVENOUS | Status: AC
  Administered 2015-11-01 (×3): 1 g via INTRAVENOUS
  Filled 2015-11-01 (×3): qty 50

## 2015-11-01 NOTE — Progress Notes (Signed)
Subjective: 1 Day Post-Op Procedure(s) (LRB): INTRAMEDULLARY (IM) RETROGRADE FEMORAL NAILING (Right) Patient reports pain as moderate.    Objective: Vital signs in last 24 hours: Temp:  [97.8 F (36.6 C)-98.6 F (37 C)] 97.8 F (36.6 C) (09/30 0448) Pulse Rate:  [64-86] 75 (09/30 0448) Resp:  [11-30] 18 (09/30 0448) BP: (110-142)/(70-98) 110/70 (09/30 0448) SpO2:  [93 %-99 %] 98 % (09/30 0448) Weight:  [112.5 kg (248 lb 0.3 oz)-113.4 kg (250 lb)] 112.5 kg (248 lb 0.3 oz) (09/30 0000)  Intake/Output from previous day: 09/29 0701 - 09/30 0700 In: 3240 [P.O.:240; I.V.:2950; IV Piggyback:50] Out: 400 [Urine:300; Blood:100] Intake/Output this shift: No intake/output data recorded.   Recent Labs  10/31/15 1900 10/31/15 1917  HGB 14.7 15.3    Recent Labs  10/31/15 1900 10/31/15 1917  WBC 13.0*  --   RBC 4.85  --   HCT 43.4 45.0  PLT 267  --     Recent Labs  10/31/15 1900 10/31/15 1917  NA 135 141  K 3.2* 3.4*  CL 105 105  CO2 22  --   BUN 18 21*  CREATININE 1.12 1.10  GLUCOSE 108* 110*  CALCIUM 8.0*  --     Recent Labs  10/31/15 1900  INR 0.95    Sensation intact distally Intact pulses distally Dorsiflexion/Plantar flexion intact Incision: dressing C/D/I Compartment soft  Assessment/Plan: 1 Day Post-Op Procedure(s) (LRB): INTRAMEDULLARY (IM) RETROGRADE FEMORAL NAILING (Right) Up with therapy  Touch down weight bearing only for now on right leg.  Kathryne HitchChristopher Y Tameia Rafferty 11/01/2015, 11:03 AM

## 2015-11-01 NOTE — Anesthesia Postprocedure Evaluation (Signed)
Anesthesia Post Note  Patient: Isaac Ellison  Procedure(s) Performed: Procedure(s) (LRB): INTRAMEDULLARY (IM) RETROGRADE FEMORAL NAILING (Right)  Patient location during evaluation: PACU Anesthesia Type: General Level of consciousness: awake and alert Pain management: pain level controlled Vital Signs Assessment: post-procedure vital signs reviewed and stable Respiratory status: spontaneous breathing, nonlabored ventilation, respiratory function stable and patient connected to nasal cannula oxygen Cardiovascular status: blood pressure returned to baseline and stable Postop Assessment: no signs of nausea or vomiting Anesthetic complications: no    Last Vitals:  Vitals:   11/01/15 0000 11/01/15 0448  BP: 122/71 110/70  Pulse: 64 75  Resp: 18 18  Temp: 36.6 C 36.6 C    Last Pain:  Vitals:   11/01/15 0448  TempSrc: Oral  PainSc:                  Shelton SilvasKevin D Chelci [place]
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
## 2015-11-02 MED ORDER — OXYCODONE-ACETAMINOPHEN 5-325 MG PO TABS: 1.0000 | ORAL_TABLET | ORAL | 0 refills | Status: DC | PRN
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
## 2015-11-02 NOTE — Progress Notes (Signed)
Subjective: 2 Days Post-Op Procedure(s) (LRB): INTRAMEDULLARY (IM) RETROGRADE FEMORAL NAILING (Right) Patient reports pain as moderate.  Has been up with therapy.  Objective: Vital signs in last 24 hours: Temp:  [98.6 F (37 C)-99 F (37.2 C)] 98.6 F (37 C) (10/01 0416) Pulse Rate:  [75-86] 75 (10/01 0416) Resp:  [18] 18 (09/30 1604) BP: (99-123)/(53-82) 115/72 (10/01 0416) SpO2:  [94 %-96 %] 94 % (10/01 0416)  Intake/Output from previous day: 09/30 0701 - 10/01 0700 In: 640 [P.O.:640] Out: -  Intake/Output this shift: No intake/output data recorded.   Recent Labs  10/31/15 1900 10/31/15 1917  HGB 14.7 15.3    Recent Labs  10/31/15 1900 10/31/15 1917  WBC 13.0*  --   RBC 4.85  --   HCT 43.4 45.0  PLT 267  --     Recent Labs  10/31/15 1900 10/31/15 1917  NA 135 141  K 3.2* 3.4*  CL 105 105  CO2 22  --   BUN 18 21*  CREATININE 1.12 1.10  GLUCOSE 108* 110*  CALCIUM 8.0*  --     Recent Labs  10/31/15 1900  INR 0.95    Sensation intact distally Intact pulses distally Dorsiflexion/Plantar flexion intact Incision: no drainage No cellulitis present Compartment soft  Assessment/Plan: 2 Days Post-Op Procedure(s) (LRB): INTRAMEDULLARY (IM) RETROGRADE FEMORAL NAILING (Right) Up with therapy  Discharge to home today.  Isaac HitchChristopher Y Blackman 11/02/2015, 8:10 AM

## 2015-11-02 NOTE — Discharge Instructions (Signed)
Put only touch down weight on your right leg for now. You can get your current dressing wet in the shower. You can remove all of your dressings by Friday and start getting your actual incisions wet daily. New dry dressings daily or large band-aids starting next Friday.
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
## 2015-11-02 NOTE — Progress Notes (Signed)
Orthopedic Tech Progress Note Patient Details:  Isaac Ellison Aug 23, 1965 [id number]  Ortho Devices Type of Ortho Device: Crutches Ortho Device/Splint Interventions: Application   [name] 11/02/2015, 12:12 PM Viewed order from doctor's order list

## 2015-11-02 NOTE — Progress Notes (Signed)
CM spoke to ortho Tech who will deliver crutches to the bedside.

## 2015-11-03 ENCOUNTER — Encounter (HOSPITAL_COMMUNITY): Payer: Self-pay | Admitting: Orthopaedic Surgery

## 2015-11-03 ENCOUNTER — Encounter (HOSPITAL_COMMUNITY): Payer: Self-pay | Admitting: *Deleted

## 2015-11-05 ENCOUNTER — Encounter: Payer: Self-pay | Admitting: Family Medicine

## 2015-11-10 ENCOUNTER — Inpatient Hospital Stay (INDEPENDENT_AMBULATORY_CARE_PROVIDER_SITE_OTHER): Payer: Self-pay | Admitting: Physician Assistant

## 2015-11-17 ENCOUNTER — Inpatient Hospital Stay (INDEPENDENT_AMBULATORY_CARE_PROVIDER_SITE_OTHER): Payer: Medicaid Other | Admitting: Physician Assistant

## 2015-11-17 ENCOUNTER — Other Ambulatory Visit: Payer: Self-pay | Admitting: Physician Assistant

## 2015-11-17 ENCOUNTER — Ambulatory Visit: Payer: Self-pay | Attending: Family Medicine | Admitting: Family Medicine

## 2015-11-17 ENCOUNTER — Encounter: Payer: Self-pay | Admitting: Family Medicine

## 2015-11-17 VITALS — BP 97/56 | HR 82 | Temp 97.7°F | Ht 71.0 in | Wt 243.2 lb

## 2015-11-17 DIAGNOSIS — S72301A Unspecified fracture of shaft of right femur, initial encounter for closed fracture: Secondary | ICD-10-CM

## 2015-11-17 DIAGNOSIS — Z79899 Other long term (current) drug therapy: Secondary | ICD-10-CM | POA: Insufficient documentation

## 2015-11-17 DIAGNOSIS — S72351A Displaced comminuted fracture of shaft of right femur, initial encounter for closed fracture: Secondary | ICD-10-CM

## 2015-11-17 DIAGNOSIS — W3400XS Accidental discharge from unspecified firearms or gun, sequela: Secondary | ICD-10-CM | POA: Insufficient documentation

## 2015-11-17 DIAGNOSIS — S72301S Unspecified fracture of shaft of right femur, sequela: Secondary | ICD-10-CM | POA: Insufficient documentation

## 2015-11-17 DIAGNOSIS — K219 Gastro-esophageal reflux disease without esophagitis: Secondary | ICD-10-CM

## 2015-11-17 MED ORDER — FAMOTIDINE 20 MG PO TABS: 20.0000 mg | ORAL_TABLET | Freq: Two times a day (BID) | ORAL | 3 refills | Status: DC

## 2015-11-17 NOTE — Patient Instructions (Signed)
Food Choices for Gastroesophageal Reflux Disease, Adult When you have gastroesophageal reflux disease (GERD), the foods you eat and your eating habits are very important. Choosing the right foods can help ease the discomfort of GERD. WHAT GENERAL GUIDELINES DO I NEED TO FOLLOW?  Choose fruits, vegetables, whole grains, low-fat dairy products, and low-fat meat, fish, and poultry.  Limit fats such as oils, salad dressings, butter, nuts, and avocado.  Keep a food diary to identify foods that cause symptoms.  Avoid foods that cause reflux. These may be different for different people.  Eat frequent small meals instead of three large meals each day.  Eat your meals slowly, in a relaxed setting.  Limit fried foods.  Cook foods using methods other than frying.  Avoid drinking alcohol.  Avoid drinking large amounts of liquids with your meals.  Avoid bending over or lying down until 2-3 hours after eating. WHAT FOODS ARE NOT RECOMMENDED? The following are some foods and drinks that may worsen your symptoms: Vegetables Tomatoes. Tomato juice. Tomato and spaghetti sauce. Chili peppers. Onion and garlic. Horseradish. Fruits Oranges, grapefruit, and lemon (fruit and juice). Meats High-fat meats, fish, and poultry. This includes hot dogs, ribs, ham, sausage, salami, and bacon. Dairy Whole milk and chocolate milk. Sour cream. Cream. Butter. Ice cream. Cream cheese.  Beverages Coffee and tea, with or without caffeine. Carbonated beverages or energy drinks. Condiments Hot sauce. Barbecue sauce.  Sweets/Desserts Chocolate and cocoa. Donuts. Peppermint and spearmint. Fats and Oils High-fat foods, including French fries and potato chips. Other Vinegar. Strong spices, such as black pepper, white pepper, red pepper, cayenne, curry powder, cloves, ginger, and chili powder. The items listed above may not be a complete list of foods and beverages to avoid. Contact your dietitian for more  information.   This information is not intended to replace advice given to you by your health care provider. Make sure you discuss any questions you have with your health care provider.   Document Released: 01/18/2005 Document Revised: 02/08/2014 Document Reviewed: 11/22/2012 Elsevier Interactive Patient Education 2016 [employer]  
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
## 2015-11-17 NOTE — Progress Notes (Signed)
Had a reaction to generic oxycodone- heart palpitations

## 2015-11-22 ENCOUNTER — Emergency Department (HOSPITAL_COMMUNITY)
Admission: EM | Admit: 2015-11-22 | Discharge: 2015-11-23 | Disposition: A | Discharge status: Home / Self Care | Payer: Self-pay | Attending: Emergency Medicine | Admitting: Emergency Medicine

## 2015-11-22 ENCOUNTER — Encounter (HOSPITAL_COMMUNITY): Payer: Self-pay | Admitting: Emergency Medicine

## 2015-11-22 DIAGNOSIS — E860 Dehydration: Secondary | ICD-10-CM | POA: Insufficient documentation

## 2015-11-22 DIAGNOSIS — R531 Weakness: Secondary | ICD-10-CM

## 2015-11-22 LAB — I-STAT TROPONIN, ED: TROPONIN I, POC: 0 ng/mL (ref 0.00–0.08)

## 2015-11-22 MED ORDER — SODIUM CHLORIDE 0.9 % IV BOLUS (SEPSIS)
1000.0000 mL | Freq: Once | INTRAVENOUS | Status: AC
  Administered 2015-11-22: 1000 mL via INTRAVENOUS
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
## 2015-11-23 ENCOUNTER — Emergency Department (HOSPITAL_COMMUNITY): Payer: Self-pay

## 2015-11-23 LAB — BASIC METABOLIC PANEL
Anion gap: 4 — ABNORMAL LOW (ref 5–15)
BUN: 19 mg/dL (ref 6–20)
CHLORIDE: 109 mmol/L (ref 101–111)
CO2: 24 mmol/L (ref 22–32)
CREATININE: 1.06 mg/dL (ref 0.61–1.24)
Calcium: 9 mg/dL (ref 8.9–10.3)
GFR calc Af Amer: >60 mL/min (ref >60)
GFR calc non Af Amer: >60 mL/min (ref >60)
Glucose, Bld: 104 mg/dL — ABNORMAL HIGH (ref 65–99)
POTASSIUM: 4.3 mmol/L (ref 3.5–5.1)
SODIUM: 137 mmol/L (ref 135–145)

## 2015-11-23 LAB — CBC WITH DIFFERENTIAL/PLATELET
Basophils Absolute: 0 10*3/uL (ref 0.0–0.1)
Basophils Relative: 0 %
EOS ABS: 0.3 10*3/uL (ref 0.0–0.7)
Eosinophils Relative: 3 %
HEMATOCRIT: 39.5 % (ref 39.0–52.0)
HEMOGLOBIN: 13 g/dL (ref 13.0–17.0)
LYMPHS ABS: 2.6 10*3/uL (ref 0.7–4.0)
LYMPHS PCT: 31 %
MCH: 28.8 pg (ref 26.0–34.0)
MCHC: 32.9 g/dL (ref 30.0–36.0)
MCV: 87.6 fL (ref 78.0–100.0)
MONOS PCT: 6 %
Monocytes Absolute: 0.5 10*3/uL (ref 0.1–1.0)
NEUTROS ABS: 5.1 10*3/uL (ref 1.7–7.7)
NEUTROS PCT: 60 %
Platelets: 364 10*3/uL (ref 150–400)
RBC: 4.51 MIL/uL (ref 4.22–5.81)
RDW: 12.9 % (ref 11.5–15.5)
WBC: 8.5 10*3/uL (ref 4.0–10.5)

## 2015-11-23 NOTE — Discharge Instructions (Signed)
You were seen today for generalized weakness. He may be slightly dehydrated. Make sure you are drinking plenty of fluids. Your workup and labs are otherwise reassuring.

## 2015-11-23 NOTE — ED Notes (Signed)
Pt ambulated in hallway on crutches without any difficulty.

## 2015-11-23 NOTE — ED Notes (Signed)
Pt verbalized understanding to drink more fluids at home and has no further questions. Pt stable and NAD. VSS

## 2015-12-01 ENCOUNTER — Ambulatory Visit: Payer: Self-pay | Attending: Family Medicine

## 2015-12-15 ENCOUNTER — Ambulatory Visit (INDEPENDENT_AMBULATORY_CARE_PROVIDER_SITE_OTHER): Payer: Self-pay

## 2015-12-15 ENCOUNTER — Ambulatory Visit (INDEPENDENT_AMBULATORY_CARE_PROVIDER_SITE_OTHER): Payer: Self-pay | Admitting: Physician Assistant

## 2015-12-15 ENCOUNTER — Encounter (INDEPENDENT_AMBULATORY_CARE_PROVIDER_SITE_OTHER): Payer: Self-pay | Admitting: Physician Assistant

## 2015-12-15 VITALS — Ht 71.0 in | Wt 246.0 lb

## 2015-12-15 DIAGNOSIS — M79604 Pain in right leg: Secondary | ICD-10-CM

## 2015-12-15 DIAGNOSIS — M96661 Fracture of femur following insertion of orthopedic implant, joint prosthesis, or bone plate, right leg: Secondary | ICD-10-CM
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
## 2016-01-12 ENCOUNTER — Ambulatory Visit (INDEPENDENT_AMBULATORY_CARE_PROVIDER_SITE_OTHER): Payer: Self-pay

## 2016-01-12 ENCOUNTER — Encounter (INDEPENDENT_AMBULATORY_CARE_PROVIDER_SITE_OTHER): Payer: Self-pay | Admitting: Physician Assistant

## 2016-01-12 ENCOUNTER — Ambulatory Visit (INDEPENDENT_AMBULATORY_CARE_PROVIDER_SITE_OTHER): Payer: Self-pay | Admitting: Physician Assistant

## 2016-01-12 VITALS — Ht 71.0 in | Wt 243.0 lb

## 2016-01-12 DIAGNOSIS — M79604 Pain in right leg: Secondary | ICD-10-CM

## 2016-01-12 NOTE — Progress Notes (Signed)
   Office Visit Note   Patient: Isaac Ellison           Date of Birth: 09/12/1965           MRN: [id number] Visit Date: 01/12/2016              Requested by: Jaclyn ShaggyEnobong Amao, MD [address] [place], [place] [address] PCP: Jaclyn ShaggyEnobong, Amao, MD   Assessment & Plan: Visit Diagnoses:  1. Leg pain, anterior, right     Plan: WEIGHT BEARING AS TOLERATED RIGHT LEG. FOLLOW UP ONE MONTH RADIOGRAPHS 2 VIEWS RIGHT FEMUR.  Follow-Up Instructions: Return in about 4 weeks (around 02/09/2016).   Orders:  Orders Placed This Encounter  Procedures  . XR FEMUR, MIN 2 VIEWS RIGHT   No orders of the defined types were placed in this encounter.     Procedures: No procedures performed   Clinical Data: No additional findings.   Subjective: Chief Complaint  Patient presents with  . Right Hip - Pain, Follow-up    Patient returns today for 3 month follow up of right IM nail. States getting a little better at a time. Still has some numbness around knee. Bearing some weight on leg and has noticed that it turns purple at times. Partial weight bearing. Ambulates with Crutches.     Review of Systems  SEE HPI  Objective: Vital Signs: Ht 5\' 11"  (1.803 m)   Wt 243 lb (110.2 kg)   BMI 33.89 kg/m   Physical Exam  Constitutional: He is oriented to person, place, and time. He appears well-developed and well-nourished. No distress.  Neurological: He is alert and oriented to person, place, and time.    Ortho Exam Surgical wounds all healing well. Calf supple with minimal tenderness.HEis able to ambulate with a single crutch. Specialty Comments:  No specialty comments available.  Imaging: Xr Femur, Min 2 Views Right  Result Date: 01/12/2016 AP and lateral views of the right femur: Further consolidation of the femur fracture. No loss of alignment position.No Hardware failure.    PMFS History: Patient Active Problem List   Diagnosis Date Noted  . Esophageal reflux 11/17/2015    . Closed fracture of shaft of right femur, initial encounter (HCC) 10/31/2015  . Viral conjunctivitis 12/12/2012  . Dental caries 09/25/2012  . Routine history and physical examination of adult 09/25/2012  . Chest pain 08/31/2012  . Anxiety 08/31/2012  . Annual physical exam 08/23/2012   No past medical history on file.  No family history on file.  Past Surgical History:  Procedure Laterality Date  . FEMUR IM NAIL Right 10/31/2015   Procedure: INTRAMEDULLARY (IM) RETROGRADE FEMORAL NAILING;  Surgeon: Kathryne Hitchhristopher Y Blackman, MD;  Location: [facility] OR;  Service: Orthopedics;  Laterality: Right;   Social History   Occupational History  . Not on file.   Social History Main Topics  . Smoking status: Never Smoker  . Smokeless tobacco: Never Used  . Alcohol use 3.0 oz/week    5 Cans of beer per week     Comment: socially  . Drug use: No  . Sexual activity: Not on file

## 2016-02-09 ENCOUNTER — Encounter (INDEPENDENT_AMBULATORY_CARE_PROVIDER_SITE_OTHER): Payer: Self-pay | Admitting: Physician Assistant

## 2016-02-09 ENCOUNTER — Ambulatory Visit (INDEPENDENT_AMBULATORY_CARE_PROVIDER_SITE_OTHER): Payer: Self-pay | Admitting: Physician Assistant

## 2016-02-09 ENCOUNTER — Ambulatory Visit (INDEPENDENT_AMBULATORY_CARE_PROVIDER_SITE_OTHER): Payer: Self-pay

## 2016-02-09 DIAGNOSIS — M898X5 Other specified disorders of bone, thigh: Secondary | ICD-10-CM

## 2016-02-09 DIAGNOSIS — M96661 Fracture of femur following insertion of orthopedic implant, joint prosthesis, or bone plate, right leg: Secondary | ICD-10-CM

## 2016-02-09 DIAGNOSIS — S72141G Displaced intertrochanteric fracture of right femur, subsequent encounter for closed fracture with delayed healing: Secondary | ICD-10-CM | POA: Insufficient documentation

## 2016-02-09 NOTE — Progress Notes (Signed)
   Office Visit Note   Patient: Isaac Ellison           Date of Birth: 1965-10-21           MRN: [id number] Visit Date: 02/09/2016              Requested by: Jaclyn ShaggyEnobong Amao, MD [address] , [place] [address] PCP: Jaclyn ShaggyEnobong, Amao, MD   Assessment & Plan: Visit Diagnoses:  1. Pain in right femur   2. Fracture of femur following insertion of orthopedic implant, joint prosthesis, or bone plate, right leg (HCC)     Plan: Encouraged him to begin riding an exercise bike and swimming strength in the right leg. He is weightbearing as tolerated right leg. Follow up in 6 weeks for AP and lateral views of the right femur  Follow-Up Instructions: Return in about 6 weeks (around 03/22/2016) for Radiographs ap and lateral right femur.   Orders:  Orders Placed This Encounter  Procedures  . XR FEMUR, MIN 2 VIEWS RIGHT   No orders of the defined types were placed in this encounter.     Procedures: No procedures performed   Clinical Data: No additional findings.   Subjective: Chief Complaint  Patient presents with  . Right Leg - Follow-up  . Follow-up    HPI [name] returns today now 3-1/2 months status post IM retrograde nailing of a right femur fracture.  Ambulating with one crutch. States he has little pain at night. He notes swelling in his calf at night  Review of Systems   Objective: Vital Signs: There were no vitals taken for this visit.  Physical Exam  Constitutional: He is oriented to person, place, and time. He appears well-developed and well-nourished. No distress.  Pulmonary/Chest: Effort normal.  Neurological: He is alert and oriented to person, place, and time.  Skin: Skin is warm and dry.    Ortho Exam Right calf supple and nontender. Right hip and knee good range of motion without pain. Specialty Comments:  No specialty comments available.  Imaging: Xr Femur, Min 2 Views Right  Result Date: 02/09/2016 Right femur AP and  lateral views: Further consolidation of the right femur fracture. No change in overall alignment position no hardware failure    PMFS History: Patient Active Problem List   Diagnosis Date Noted  . Displaced intertrochanteric fracture of right femur, subsequent encounter for closed fracture with delayed healing 02/09/2016  . Esophageal reflux 11/17/2015  . Closed fracture of shaft of right femur, initial encounter (HCC) 10/31/2015  . Viral conjunctivitis 12/12/2012  . Dental caries 09/25/2012  . Routine history and physical examination of adult 09/25/2012  . Chest pain 08/31/2012  . Anxiety 08/31/2012  . Annual physical exam 08/23/2012   No past medical history on file.  No family history on file.  Past Surgical History:  Procedure Laterality Date  . FEMUR IM NAIL Right 10/31/2015   Procedure: INTRAMEDULLARY (IM) RETROGRADE FEMORAL NAILING;  Surgeon: Kathryne Hitchhristopher Y Blackman, MD;  Location: [facility] OR;  Service: Orthopedics;  Laterality: Right;   Social History   Occupational History  . Not on file.   Social History Main Topics  . Smoking status: Never Smoker  . Smokeless tobacco: Never Used  . Alcohol use 3.0 oz/week    5 Cans of beer per week     Comment: socially  . Drug use: No  . Sexual activity: Not on file

## 2016-03-22 ENCOUNTER — Ambulatory Visit (INDEPENDENT_AMBULATORY_CARE_PROVIDER_SITE_OTHER): Payer: Self-pay

## 2016-03-22 ENCOUNTER — Ambulatory Visit (INDEPENDENT_AMBULATORY_CARE_PROVIDER_SITE_OTHER): Payer: MEDICAID | Admitting: Orthopaedic Surgery

## 2016-03-22 DIAGNOSIS — M79604 Pain in right leg: Secondary | ICD-10-CM

## 2016-03-22 DIAGNOSIS — S72301A Unspecified fracture of shaft of right femur, initial encounter for closed fracture: Secondary | ICD-10-CM
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
## 2016-04-08 ENCOUNTER — Ambulatory Visit: Payer: Self-pay | Attending: Family Medicine | Admitting: Physician Assistant

## 2016-04-08 VITALS — BP 137/88 | HR 65 | Temp 98.3°F | Resp 16 | Wt 250.6 lb

## 2016-04-08 DIAGNOSIS — K625 Hemorrhage of anus and rectum: Secondary | ICD-10-CM | POA: Insufficient documentation

## 2016-04-08 DIAGNOSIS — K5900 Constipation, unspecified: Secondary | ICD-10-CM | POA: Insufficient documentation

## 2016-04-08 DIAGNOSIS — N529 Male erectile dysfunction, unspecified: Secondary | ICD-10-CM | POA: Insufficient documentation

## 2016-04-08 LAB — COMPREHENSIVE METABOLIC PANEL
ALBUMIN: 3.9 g/dL (ref 3.6–5.1)
ALT: 14 U/L (ref 9–46)
AST: 17 U/L (ref 10–40)
Alkaline Phosphatase: 147 U/L — ABNORMAL HIGH (ref 40–115)
BILIRUBIN TOTAL: 0.5 mg/dL (ref 0.2–1.2)
BUN: 17 mg/dL (ref 7–25)
CALCIUM: 9.2 mg/dL (ref 8.6–10.3)
CHLORIDE: 104 mmol/L (ref 98–110)
CO2: 22 mmol/L (ref 20–31)
Creat: 1 mg/dL (ref 0.60–1.35)
Glucose, Bld: 84 mg/dL (ref 65–99)
Potassium: 4.3 mmol/L (ref 3.5–5.3)
Sodium: 139 mmol/L (ref 135–146)
Total Protein: 7 g/dL (ref 6.1–8.1)

## 2016-04-08 LAB — CBC WITH DIFFERENTIAL/PLATELET
BASOS ABS: 0 {cells}/uL (ref 0–200)
Basophils Relative: 0 %
EOS ABS: 158 {cells}/uL (ref 15–500)
Eosinophils Relative: 2 %
HEMATOCRIT: 43 % (ref 38.5–50.0)
HEMOGLOBIN: 14.4 g/dL (ref 13.2–17.1)
Lymphocytes Relative: 40 %
Lymphs Abs: 3160 cells/uL (ref 850–3900)
MCH: 27.9 pg (ref 27.0–33.0)
MCHC: 33.5 g/dL (ref 32.0–36.0)
MCV: 83.2 fL (ref 80.0–100.0)
MONO ABS: 553 {cells}/uL (ref 200–950)
MPV: 8.9 fL (ref 7.5–12.5)
Monocytes Relative: 7 %
NEUTROS PCT: 51 %
Neutro Abs: 4029 cells/uL (ref 1500–7800)
Platelets: 267 10*3/uL (ref 140–400)
RBC: 5.17 MIL/uL (ref 4.20–5.80)
RDW: 16.2 % — ABNORMAL HIGH (ref 11.0–15.0)
WBC: 7.9 10*3/uL (ref 3.8–10.8)

## 2016-04-08 LAB — TSH: TSH: 3.81 mIU/L (ref 0.40–4.50)

## 2016-04-08 MED ORDER — PSYLLIUM 58.6 % PO PACK: 1.0000 | PACK | Freq: Every day | ORAL | 12 refills | Status: DC

## 2016-04-08 NOTE — Progress Notes (Signed)
Isaac Ellison, is a 51 y.o. male  ZOX:[id number]  [id number]  DOB - 08-Oct-1965  Subjective:  Chief Complaint and HPI: Isaac Ellison is a 51 y.o. male here today for 5 month h/o intermittent bright red blood in his stool.  Dietary choices seem to aggravate this.  He has 2 BMs daily.  Describes the quantity as small to moderate amount.  Some constipation.  No diarrhea.  Long-standing acid-reflux.  No change in appetite.  No weight loss.  No abdominal pain.  No f/c.  No mucus in stool.  He has not noticed a hemorrhoid.  He drinks ~2 bottles water per day.    Also having trouble with maintaining an erection for about 1 year.  He is able to get an erection but it's not as firm as it used to be and he isn't able to maintain it.  Social History:  Married, monogamous Family history:  No colon CA  ROS:   Constitutional:  No f/c, No night sweats, No unexplained weight loss. EENT:  No vision changes, No blurry vision, No hearing changes. No mouth, throat, or ear problems.  Respiratory: No cough, No SOB Cardiac: No CP, no palpitations GI:  No abd pain, No N/V/D.  + constipation and BRBPR GU: No Urinary s/sx Musculoskeletal: No joint pain Neuro: No headache, no dizziness, no motor weakness.  Skin: No rash Endocrine:  No polydipsia. No polyuria.  Psych: Denies SI/HI  No problems updated.  ALLERGIES: Allergies  Allergen Reactions  . Caffeine Other (See Comments)    Chest pain  . Cinnamon Other (See Comments)    Tongue numbness    PAST MEDICAL HISTORY: No past medical history on file.  MEDICATIONS AT HOME: Prior to Admission medications   Medication Sig Start Date End Date Taking? Authorizing Provider  famotidine (PEPCID) 20 MG tablet Take 1 tablet (20 mg total) by mouth 2 (two) times daily. 11/17/15  Yes Jaclyn ShaggyEnobong Amao, MD  naproxen (NAPROSYN) 500 MG tablet Take 500 mg by mouth 2 (two) times daily with a meal.    Historical Provider, MD  psyllium (METAMUCIL) 58.6  % packet Take 1 packet by mouth daily. 04/08/16   Anders SimmondsAngela M Riot Barrick, PA-C     Objective:  EXAM:   Vitals:   04/08/16 0932  BP: 137/88  Pulse: 65  Resp: 16  Temp: 98.3 F (36.8 C)  TempSrc: Oral  SpO2: 96%  Weight: 250 lb 9.6 oz (113.7 kg)    General appearance : A&OX3. NAD. Non-toxic-appearing HEENT: Atraumatic and Normocephalic.  PERRLA. EOM intact.  TM clear B. Mouth-MMM Neck: supple, no JVD. No cervical lymphadenopathy. No thyromegaly Chest/Lungs:  Breathing-non-labored, Good air entry bilaterally, breath sounds normal without rales, rhonchi, or wheezing  CVS: S1 S2 regular, no murmurs, gallops, rubs  Abdomen:  Non-distended.  BS normal.  No hepatosplenomegaly, no rebound, guarding, or tenderness.   Extremities: Bilateral Lower Ext shows no edema, both legs are warm to touch with = pulse throughout Neurology:  CN II-XII grossly intact, Non focal.   Psych:  TP linear. J/I WNL. Normal speech. Appropriate eye contact and affect.  Skin:  No Rash  Data Review No results found for: HGBA1C   Assessment & Plan   1. Constipation, unspecified constipation type Increase water intake - Comprehensive metabolic panel - TSH - psyllium (METAMUCIL) 58.6 % packet; Take 1 packet by mouth daily.  Dispense: 30 each; Refill: 12 - Ambulatory referral to Gastroenterology  2. BRBPR (bright red blood per rectum) - CBC with  Differential/Platelet - Comprehensive metabolic panel - Ambulatory referral to Gastroenterology  3. Erectile dysfunction, unspecified erectile dysfunction type Erection rings recommended and counseled on how to use. - Comprehensive metabolic panel - TSH - Testosterone  Spent 35 mins face to face counseling.  Patient have been counseled extensively about nutrition and exercise  Return in about 3 weeks (around 04/29/2016) for Dr Amao-f/up erectile dysfunction and constipation.  The patient was given clear instructions to go to ER or return to medical center if  symptoms don't improve, worsen or new problems develop. The patient verbalized understanding. The patient was told to call to get lab results if they haven't heard anything in the next week.     Georgian Co, PA-C [facility] [place], [place] [phone]   04/08/2016, 9:53 AMPatient ID: Bexton Haak, male   DOB: August 26, 1965, 51 y.o.   MRN: [id number]

## 2016-04-08 NOTE — Patient Instructions (Signed)
Increase water intake to 80-100 ounces water daily   Constipacin en los adultos (Constipation, Adult) Constipacin significa que una persona defeca en una semana menos que lo normal, hay dificultad para defecar, o las heces son secas, duras, o ms grandes que lo normal. La causa puede ser una afeccin subyacente. Puede empeorar con la edad si una persona toma ciertos medicamentos y no toma suficiente lquido. INSTRUCCIONES PARA EL CUIDADO EN EL HOGAR Comida y bebida  Consuma alimentos con alto contenido de [place], como frutas y verduras frescas, cereales integrales y frijoles.  Limite los alimentos ricos en grasas y con bajo contenido de [place], o muy procesados, como las papas fritas, [place]amb[place]as, Penermongalletas, dulces y refrescos.  Beba suficiente lquido para Photographermantener la orina clara o de color amarillo plido. Instrucciones generales  Haga actividad fsica habitualmente o como se lo haya indicado el mdico.  Vaya al bao cuando sienta la necesidad de ir. No se aguante las ganas.  Tome los medicamentos de venta libre y los recetados solamente como se lo haya indicado el mdico. Estos incluyen los suplementos de [place]ra.  Practique ejercicios de rehabilitacin del suelo plvico, como la respiracin profunda mientras relaja la parte inferior del abdomen y relajacin del suelo plvico mientras defeca.  Controle su afeccin para ver si hay cambios.  Concurra a todas las visitas de control como se lo haya indicado el mdico. Esto es importante. SOLICITE ATENCIN MDICA SI:  Siente un dolor que empeora.  Tiene fiebre.  No defeca despus de 4das.  Vomita.  No tiene hambre.  Pierde peso.  Tiene una hemorragia en el ano.  Las heces son delgadas como un lpiz. SOLICITE ATENCIN MDICA DE INMEDIATO SI:  Tiene fiebre y los sntomas empeoran repentinamente.  Observa que se filtran heces o hay sangre en las heces.  Tiene el abdomen hinchado.  Siente un dolor intenso en el  abdomen.  Se siente mareado o se desmaya. Esta informacin no tiene Theme park managercomo fin reemplazar el consejo del mdico. Asegrese de hacerle al mdico cualquier pregunta que tenga. Document Released: 02/07/2007 Document Revised: 02/08/2014 Document Reviewed: 07/09/2015 Elsevier Interactive Patient Education  2017 [employer]

## 2016-04-09 LAB — TESTOSTERONE: TESTOSTERONE: 234 ng/dL — AB (ref 250–827)

## 2016-04-20 ENCOUNTER — Telehealth: Payer: Self-pay

## 2016-04-20 NOTE — Telephone Encounter (Signed)
-----   Message from Anders SimmondsAngela M McClung, [place] JerseyPA-C sent at 04/09/2016  1:51 PM EST ----- Your thyroid hormone and blood count are normal.  Your alkaline phosphatase is a little elevated and your testosterone is a little low.  Return to see Dr Venetia NightAmao as we discussed previously.  These tests may indicate further testing that needs to be done and to decide if you need testosterone replacement for erectile issues. Thanks, Georgian CoAngela McClung, PA-C

## 2016-04-20 NOTE — Telephone Encounter (Signed)
CMA call to inform patient about lab results   Patient did not answer & no VM set up yet to leave a message

## 2016-04-28 ENCOUNTER — Ambulatory Visit: Payer: Self-pay | Attending: Family Medicine

## 2016-05-02 ENCOUNTER — Other Ambulatory Visit: Payer: Self-pay | Admitting: Family Medicine

## 2016-05-02 DIAGNOSIS — K219 Gastro-esophageal reflux disease without esophagitis: Secondary | ICD-10-CM

## 2016-05-11 ENCOUNTER — Ambulatory Visit: Payer: Self-pay | Admitting: Family Medicine

## 2016-05-14 ENCOUNTER — Ambulatory Visit: Payer: Self-pay | Admitting: Family Medicine

## 2016-06-07 ENCOUNTER — Ambulatory Visit: Payer: Self-pay | Admitting: Family Medicine

## 2017-01-11 ENCOUNTER — Other Ambulatory Visit: Payer: Self-pay | Admitting: Family Medicine

## 2017-01-11 DIAGNOSIS — K219 Gastro-esophageal reflux disease without esophagitis: Secondary | ICD-10-CM

## 2019-06-04 ENCOUNTER — Emergency Department (HOSPITAL_COMMUNITY): Payer: Self-pay

## 2019-06-04 ENCOUNTER — Emergency Department (HOSPITAL_COMMUNITY)
Admission: EM | Admit: 2019-06-04 | Discharge: 2019-06-04 | Disposition: A | Discharge status: Home / Self Care | Payer: Self-pay | Attending: Emergency Medicine | Admitting: Emergency Medicine

## 2019-06-04 ENCOUNTER — Encounter (HOSPITAL_COMMUNITY): Payer: Self-pay | Admitting: Emergency Medicine

## 2019-06-04 ENCOUNTER — Other Ambulatory Visit: Payer: Self-pay

## 2019-06-04 DIAGNOSIS — R0789 Other chest pain: Secondary | ICD-10-CM | POA: Insufficient documentation

## 2019-06-04 DIAGNOSIS — Z5321 Procedure and treatment not carried out due to patient leaving prior to being seen by health care provider: Secondary | ICD-10-CM | POA: Insufficient documentation

## 2019-06-04 DIAGNOSIS — R0602 Shortness of breath: Secondary | ICD-10-CM | POA: Insufficient documentation

## 2019-06-04 LAB — CBC
HCT: 46.3 % (ref 39.0–52.0)
Hemoglobin: 15.7 g/dL (ref 13.0–17.0)
MCH: 30.8 pg (ref 26.0–34.0)
MCHC: 33.9 g/dL (ref 30.0–36.0)
MCV: 90.8 fL (ref 80.0–100.0)
Platelets: 259 10*3/uL (ref 150–400)
RBC: 5.1 MIL/uL (ref 4.22–5.81)
RDW: 12.2 % (ref 11.5–15.5)
WBC: 7.7 10*3/uL (ref 4.0–10.5)
nRBC: 0 % (ref 0.0–0.2)

## 2019-06-04 LAB — BASIC METABOLIC PANEL
Anion gap: 8 (ref 5–15)
BUN: 13 mg/dL (ref 6–20)
CO2: 22 mmol/L (ref 22–32)
Calcium: 8.6 mg/dL — ABNORMAL LOW (ref 8.9–10.3)
Chloride: 108 mmol/L (ref 98–111)
Creatinine, Ser: 0.93 mg/dL (ref 0.61–1.24)
GFR calc Af Amer: >60 mL/min (ref >60)
GFR calc non Af Amer: >60 mL/min (ref >60)
Glucose, Bld: 106 mg/dL — ABNORMAL HIGH (ref 70–99)
Potassium: 3.9 mmol/L (ref 3.5–5.1)
Sodium: 138 mmol/L (ref 135–145)

## 2019-06-04 LAB — PROTIME-INR
INR: 0.9 (ref 0.8–1.2)
Prothrombin Time: 11.6 seconds (ref 11.4–15.2)

## 2019-06-04 LAB — TROPONIN I (HIGH SENSITIVITY): Troponin I (High Sensitivity): 4 ng/L (ref <18)

## 2019-06-04 MED ORDER — SODIUM CHLORIDE 0.9% FLUSH: 3.0000 mL | Freq: Once | INTRAVENOUS | Status: DC

## 2019-06-04 NOTE — ED Triage Notes (Signed)
Patient repots left chest tightness/pressure onset this evening with mild SOB , no emesis or diaphoresis , denies cough or fever .

## 2020-04-20 ENCOUNTER — Other Ambulatory Visit: Payer: Self-pay

## 2020-04-20 ENCOUNTER — Encounter (HOSPITAL_COMMUNITY): Payer: Self-pay | Admitting: Emergency Medicine

## 2020-04-20 ENCOUNTER — Emergency Department (HOSPITAL_COMMUNITY)
Admission: EM | Admit: 2020-04-20 | Discharge: 2020-04-20 | Disposition: A | Discharge status: Home / Self Care | Payer: Self-pay | Attending: Emergency Medicine | Admitting: Emergency Medicine

## 2020-04-20 DIAGNOSIS — K644 Residual hemorrhoidal skin tags: Secondary | ICD-10-CM | POA: Insufficient documentation

## 2020-04-20 DIAGNOSIS — K625 Hemorrhage of anus and rectum: Secondary | ICD-10-CM

## 2020-04-20 LAB — CBC
HCT: 45.4 % (ref 39.0–52.0)
Hemoglobin: 15.2 g/dL (ref 13.0–17.0)
MCH: 31.4 pg (ref 26.0–34.0)
MCHC: 33.5 g/dL (ref 30.0–36.0)
MCV: 93.8 fL (ref 80.0–100.0)
Platelets: 255 10*3/uL (ref 150–400)
RBC: 4.84 MIL/uL (ref 4.22–5.81)
RDW: 12.3 % (ref 11.5–15.5)
WBC: 12 10*3/uL — ABNORMAL HIGH (ref 4.0–10.5)
nRBC: 0 % (ref 0.0–0.2)

## 2020-04-20 LAB — COMPREHENSIVE METABOLIC PANEL
ALT: 20 U/L (ref 0–44)
AST: 22 U/L (ref 15–41)
Albumin: 3.5 g/dL (ref 3.5–5.0)
Alkaline Phosphatase: 53 U/L (ref 38–126)
Anion gap: 7 (ref 5–15)
BUN: 14 mg/dL (ref 6–20)
CO2: 23 mmol/L (ref 22–32)
Calcium: 8.7 mg/dL — ABNORMAL LOW (ref 8.9–10.3)
Chloride: 110 mmol/L (ref 98–111)
Creatinine, Ser: 0.84 mg/dL (ref 0.61–1.24)
GFR, Estimated: >60 mL/min (ref >60)
Glucose, Bld: 107 mg/dL — ABNORMAL HIGH (ref 70–99)
Potassium: 3.6 mmol/L (ref 3.5–5.1)
Sodium: 140 mmol/L (ref 135–145)
Total Bilirubin: 0.5 mg/dL (ref 0.3–1.2)
Total Protein: 6.5 g/dL (ref 6.5–8.1)

## 2020-04-20 LAB — TYPE AND SCREEN
ABO/RH(D): A POS
Antibody Screen: NEGATIVE

## 2020-04-20 LAB — POC OCCULT BLOOD, ED: Fecal Occult Bld: POSITIVE — AB

## 2020-04-20 MED ORDER — HYDROCORTISONE (PERIANAL) 2.5 % EX CREA
1.0000 | TOPICAL_CREAM | Freq: Two times a day (BID) | CUTANEOUS | 0 refills | Status: DC
Start: 2020-04-20 — End: 2022-12-25

## 2020-04-20 NOTE — ED Notes (Signed)
ED Provider at bedside with this RN for rectal exam 

## 2020-04-20 NOTE — ED Provider Notes (Signed)
[facility] EMERGENCY DEPARTMENT Provider Note   CSN: [id number] Arrival date & time: 04/20/20  1733     History Chief Complaint  Patient presents with  . Rectal Bleeding    Isaac Ellison is a 55 y.o. male.  Patient presents the emergency department for evaluation of rectal bleeding.  Patient states that he has had symptoms for several months, but over the past 2 weeks he has had red blood with every bowel movement.  Mild discomfort reported but no severe pain with bowel movements.  No chest pain, shortness of breath, lightheadedness or syncope.  Patient is not on any blood thinners.  He denies heavy NSAID use.  He drinks 5 drinks of alcohol a week.  He has never had a colonoscopy.  Patient was seen by his primary care doctor for this problem.  He was prescribed Metamucil and planned to have him follow-up with GI, however he states that he has not received information about follow-up with them.          History reviewed. No pertinent past medical history.  Patient Active Problem List   Diagnosis Date Noted  . Displaced intertrochanteric fracture of right femur, subsequent encounter for closed fracture with delayed healing 02/09/2016  . Esophageal reflux 11/17/2015  . Closed fracture of shaft of right femur, initial encounter (HCC) 10/31/2015  . Viral conjunctivitis 12/12/2012  . Dental caries 09/25/2012  . Routine history and physical examination of adult 09/25/2012  . Chest pain 08/31/2012  . Anxiety 08/31/2012  . Annual physical exam 08/23/2012    Past Surgical History:  Procedure Laterality Date  . FEMUR IM NAIL Right 10/31/2015   Procedure: INTRAMEDULLARY (IM) RETROGRADE FEMORAL NAILING;  Surgeon: Kathryne Hitch, MD;  Location: [facility] OR;  Service: Orthopedics;  Laterality: Right;       No family history on file.  Social History   Tobacco Use  . Smoking status: Never Smoker  . Smokeless tobacco: Never Used  Substance Use Topics  .  Alcohol use: Yes    Alcohol/week: 5.0 standard drinks    Types: 5 Cans of beer per week    Comment: socially  . Drug use: No    Home Medications Prior to Admission medications   Medication Sig Start Date End Date Taking? Authorizing Provider  famotidine (PEPCID) 20 MG tablet TAKE 1 TABLET (20 MG TOTAL) BY MOUTH 2 (TWO) TIMES DAILY. 05/03/16   Hoy Register, MD  naproxen (NAPROSYN) 500 MG tablet Take 500 mg by mouth 2 (two) times daily with a meal.    [provider]  psyllium (METAMUCIL) 58.6 % packet Take 1 packet by mouth daily. 04/08/16   Anders Simmonds, PA-C    Allergies    Caffeine and Cinnamon  Review of Systems   Review of Systems  Constitutional: Negative for fever.  HENT: Negative for rhinorrhea and sore throat.   Eyes: Negative for redness.  Respiratory: Negative for shortness of breath.   Cardiovascular: Negative for chest pain.  Gastrointestinal: Positive for anal bleeding and rectal pain. Negative for abdominal pain, diarrhea, nausea and vomiting.  Genitourinary: Negative for dysuria and hematuria.  Musculoskeletal: Negative for myalgias.  Skin: Negative for rash.  Neurological: Negative for syncope and light-headedness.    Physical Exam Updated Vital Signs BP 126/76   Pulse 65   Temp 98.2 F (36.8 C)   Resp 16   SpO2 99%   Physical Exam Vitals and nursing note reviewed. Exam conducted with a chaperone present.  Constitutional:  Appearance: He is well-developed.  HENT:     Head: Normocephalic and atraumatic.  Eyes:     General:        Right eye: No discharge.        Left eye: No discharge.     Conjunctiva/sclera: Conjunctivae normal.  Cardiovascular:     Rate and Rhythm: Normal rate and regular rhythm.     Heart sounds: Normal heart sounds.  Pulmonary:     Effort: Pulmonary effort is normal.     Breath sounds: Normal breath sounds.  Abdominal:     Palpations: Abdomen is soft.     Tenderness: There is no abdominal tenderness.      Comments: No abdominal tenderness to palpation.  Genitourinary:    Rectum: External hemorrhoid present. No tenderness or anal fissure.     Comments: Patient with 2 visible external hemorrhoids.  Patient noted to have some dried blood around the rectum.  One of the hemorrhoids appears soft and pink, the other appears inflamed, slightly purpleish but is not thrombosed.   Musculoskeletal:     Cervical back: Normal range of motion and neck supple.  Skin:    General: Skin is warm and dry.  Neurological:     Mental Status: He is alert.     ED Results / Procedures / Treatments   Labs (all labs ordered are listed, but only abnormal results are displayed) Labs Reviewed  COMPREHENSIVE METABOLIC PANEL - Abnormal; Notable for the following components:      Result Value   Glucose, Bld 107 (*)    Calcium 8.7 (*)    All other components within normal limits  CBC - Abnormal; Notable for the following components:   WBC 12.0 (*)    All other components within normal limits  POC OCCULT BLOOD, ED  TYPE AND SCREEN  ABO/RH    EKG None  Radiology No results found.  Procedures Procedures   Medications Ordered in ED Medications - No data to display  ED Course  I have reviewed the triage vital signs and the nursing notes.  Pertinent labs & imaging results that were available during my care of the patient were reviewed by me and considered in my medical decision making (see chart for details).  Patient seen and examined.  Work-up reviewed.  Rectal exam performed with RN chaperone.  Discussed lab results with patient including reassuring hemoglobin.  Vital signs reviewed and are as follows: BP 126/76   Pulse 65   Temp 98.2 F (36.8 C)   Resp 16   SpO2 99%   Will prescribe hydrocortisone ointment and have patient do sitz baths.  Will give GI referral instructions.  Encouraged return with worsening symptoms including pain, large amount of blood being passed in the stool, lightheadedness or  syncope.    MDM Rules/Calculators/A&P                          Lower GI bleeding suspected given passage of heme positive stool, bright red blood per rectum, hematochezia (maroon stool), clots noted in stool, red blood on toilet paper or in toilet bowl, no melanotic stools, presentation not consistent with a large upper GI bleed.  The following differential diagnoses were considered for this patient's lower GI bleed: *Hemorrhoids - can be painless, blood noted on toilet paper *Anal fissure - tearing pain with defecation, small amount of blood *Colonic polyp - usually painless *Proctitis - usually associated with passage of mucus and diarrhea *  IBD - presents with crampy abdominal pain, tenesmus, fever *Infectious diarrhea - usually abrupt onset* *Diverticulosis - large volume of painless bleeding, >40yo *Mesenteric ischemia - >50yo, underlying cardiovascular disease, pain out of proportion *Colon cancer *Arteriovenous malformation  None of the following red flags identified or suspected: *Abnormal vital signs *Symptoms suggestive of malignancy such as constitutional symptoms (fever, weight loss), anemia, or change in frequency, caliber or consistency of stools * Family history of colon cancer  The patient appears reasonably screened and/or stabilized for discharge and I doubt any other medical condition or other emergency medical conditions requiring further screening, evaluation, or treatment in the ED at this time prior to discharge.  Age greater than 40 -- GI/PCP referral for sigmoidoscopy or colonoscopy   Final Clinical Impression(s) / ED Diagnoses Final diagnoses:  Rectal bleeding  External hemorrhoids    Rx / DC Orders ED Discharge Orders         Ordered    hydrocortisone (ANUSOL-HC) 2.5 % rectal cream  2 times daily        04/20/20 2147           Renne Crigler, Cordelia Poche 04/20/20 2150    Lorre Nick, MD 04/22/20 1032

## 2020-04-20 NOTE — Discharge Instructions (Signed)
Please read and follow all provided instructions.  Your diagnoses today include:  1. Rectal bleeding   2. External hemorrhoids     Tests performed today include: Blood cell counts (white, red, and platelets) - blood counts were normal Electrolytes  Kidney function test  Vital signs. See below for your results today.   Medications prescribed:   Anusol cream  Take any prescribed medications only as directed.  Home care instructions:  Follow any educational materials contained in this packet.  Soak in warm water twice a day.   BE VERY CAREFUL not to take multiple medicines containing Tylenol (also called acetaminophen). Doing so can lead to an overdose which can damage your liver and cause liver failure and possibly death.   Follow-up instructions: Please follow-up with your primary care provider or GI referral in 1 week.   Return instructions:   Please return to the Emergency Department if you experience worsening symptoms.   Please return if you have any other emergent concerns.  Additional Information:  Your vital signs today were: BP 126/76   Pulse 65   Temp 98.2 F (36.8 C)   Resp 16   SpO2 99%  If your blood pressure (BP) was elevated above 135/85 this visit, please have this repeated by your doctor within one month. --------------

## 2020-04-20 NOTE — ED Triage Notes (Signed)
C/o rectal bleeding x 6 months.  Denies pain.

## 2020-04-20 NOTE — ED Notes (Signed)
Patient not in waiting room  

## 2021-08-26 ENCOUNTER — Encounter: Payer: Self-pay | Admitting: Emergency Medicine

## 2021-08-26 ENCOUNTER — Ambulatory Visit
Admission: EM | Admit: 2021-08-26 | Discharge: 2021-08-26 | Disposition: A | Discharge status: Home / Self Care | Payer: Self-pay | Attending: Urgent Care | Admitting: Urgent Care

## 2021-08-26 DIAGNOSIS — L03317 Cellulitis of buttock: Secondary | ICD-10-CM

## 2021-08-26 MED ORDER — NAPROXEN 500 MG PO TABS: 500.0000 mg | ORAL_TABLET | Freq: Two times a day (BID) | ORAL | 0 refills | Status: AC

## 2021-08-26 MED ORDER — DOXYCYCLINE HYCLATE 100 MG PO CAPS: 100.0000 mg | ORAL_CAPSULE | Freq: Two times a day (BID) | ORAL | 0 refills | Status: DC

## 2021-08-26 NOTE — ED Provider Notes (Signed)
[facility] - URGENT CARE CENTER   MRN: [id number] DOB: 1965/10/23  Subjective:   Isaac Ellison is a 56 y.o. male presenting for 3-day history of acute onset persistent right buttock pain with drainage and slight bleeding.  Patient reports that he believes something bit him or stung him.  This happened after he tried to sit in his truck and felt a sharp sting.  Denies fever, nausea, vomiting, abdominal pelvic pain.  Tdap is up-to-date.  No current facility-administered medications for this encounter.  Current Outpatient Medications:    famotidine (PEPCID) 20 MG tablet, TAKE 1 TABLET (20 MG TOTAL) BY MOUTH 2 (TWO) TIMES DAILY., Disp: 60 tablet, Rfl: 3   hydrocortisone (ANUSOL-HC) 2.5 % rectal cream, Place 1 application rectally 2 (two) times daily., Disp: 30 g, Rfl: 0   naproxen (NAPROSYN) 500 MG tablet, Take 500 mg by mouth 2 (two) times daily with a meal., Disp: , Rfl:    psyllium (METAMUCIL) 58.6 % packet, Take 1 packet by mouth daily., Disp: 30 each, Rfl: 12   Allergies  Allergen Reactions   Caffeine Other (See Comments)    Chest pain   Cinnamon Other (See Comments)    Tongue numbness    History reviewed. No pertinent past medical history.   Past Surgical History:  Procedure Laterality Date   FEMUR IM NAIL Right 10/31/2015   Procedure: INTRAMEDULLARY (IM) RETROGRADE FEMORAL NAILING;  Surgeon: Kathryne Hitch, MD;  Location: [facility] OR;  Service: Orthopedics;  Laterality: Right;    History reviewed. No pertinent family history.  Social History   Tobacco Use   Smoking status: Never   Smokeless tobacco: Never  Substance Use Topics   Alcohol use: Yes    Alcohol/week: 5.0 standard drinks of alcohol    Types: 5 Cans of beer per week    Comment: socially   Drug use: No    ROS   Objective:   Vitals: BP 112/66   Pulse 91   Temp 99 F (37.2 C)   Resp 20   SpO2 98%   Physical Exam Constitutional:      General: He is not in acute distress.     Appearance: Normal appearance. He is well-developed and normal weight. He is not ill-appearing, toxic-appearing or diaphoretic.  HENT:     Head: Normocephalic and atraumatic.     Right Ear: External ear normal.     Left Ear: External ear normal.     Nose: Nose normal.     Mouth/Throat:     Pharynx: Oropharynx is clear.  Eyes:     General: No scleral icterus.       Right eye: No discharge.        Left eye: No discharge.     Extraocular Movements: Extraocular movements intact.  Cardiovascular:     Rate and Rhythm: Normal rate.  Pulmonary:     Effort: Pulmonary effort is normal.  Genitourinary:   Musculoskeletal:     Cervical back: Normal range of motion.  Neurological:     Mental Status: He is alert and oriented to person, place, and time.  Psychiatric:        Mood and Affect: Mood normal.        Behavior: Behavior normal.        Thought Content: Thought content normal.        Judgment: Judgment normal.        Assessment and Plan :   PDMP not reviewed this encounter.  1. Cellulitis of right  buttock    Wound was likely an abscess that ruptured.  Tdap is up-to-date anyhow in the event that he suffered a puncture wound.  Recommended starting doxycycline, discussed wound care.  Naproxen for pain relief. Counseled patient on potential for adverse effects with medications prescribed/recommended today, ER and return-to-clinic precautions discussed, patient verbalized understanding.    Wallis Bamberg, PA-C 08/26/21 1316

## 2021-08-26 NOTE — ED Triage Notes (Signed)
Pt here with a possible bug bite or abscess of the right buttock x 3 days. It has started to drain and it painful.

## 2021-08-28 ENCOUNTER — Emergency Department (HOSPITAL_COMMUNITY)
Admission: EM | Admit: 2021-08-28 | Discharge: 2021-08-28 | Disposition: A | Discharge status: Home / Self Care | Payer: Self-pay | Attending: Emergency Medicine | Admitting: Emergency Medicine

## 2021-08-28 ENCOUNTER — Other Ambulatory Visit: Payer: Self-pay

## 2021-08-28 ENCOUNTER — Encounter (HOSPITAL_COMMUNITY): Payer: Self-pay | Admitting: Emergency Medicine

## 2021-08-28 DIAGNOSIS — L0231 Cutaneous abscess of buttock: Secondary | ICD-10-CM | POA: Insufficient documentation

## 2021-08-28 DIAGNOSIS — L0291 Cutaneous abscess, unspecified: Secondary | ICD-10-CM

## 2021-08-28 MED ORDER — SULFAMETHOXAZOLE-TRIMETHOPRIM 800-160 MG PO TABS: 1.0000 | ORAL_TABLET | Freq: Two times a day (BID) | ORAL | 0 refills | Status: AC

## 2021-08-28 MED ORDER — CEPHALEXIN 500 MG PO CAPS: 500.0000 mg | ORAL_CAPSULE | Freq: Four times a day (QID) | ORAL | 0 refills | Status: AC

## 2021-08-28 MED ORDER — LIDOCAINE-EPINEPHRINE (PF) 2 %-1:200000 IJ SOLN
20.0000 mL | Freq: Once | INTRAMUSCULAR | Status: AC
  Administered 2021-08-28: 20 mL
  Filled 2021-08-28: qty 20

## 2021-08-28 NOTE — ED Provider Notes (Signed)
3:52 PM-patient presents for evaluation of painful draining mass, present for several days despite taking doxycycline and Naprosyn.  He was seen and evaluated at a urgent care for this, 2 days ago.  He feels that it is worsening.  Therefore he came to the ED for evaluation.  Right medial upper buttocks with tender indurated mass minimally draining, with mild surrounding erythema.  Evaluation is consistent with abscess requiring drainage.   Patient is currently in a hallway chair, and needs to be placed in a room for I&D.  Care by oncoming team after he is placed in a room.   Mancel Bale, MD 08/28/21 [phone]

## 2021-08-28 NOTE — Discharge Instructions (Addendum)
OK to remove your packing in approximately 3 days. This does not need to be replaced.

## 2021-08-28 NOTE — ED Triage Notes (Signed)
Patient here for evaluation of wound on buttock, patient states he was bitten by an insect on his buttocks last Saturday. Was seen at urgent care and prescribed doxycycline but patient states pain is getting worse.

## 2021-09-03 NOTE — ED Provider Notes (Signed)
[facility] EMERGENCY DEPARTMENT Provider Note   CSN: [id number] Arrival date & time: 08/28/21  1418     History  Chief Complaint  Patient presents with   Insect Bite    Isaac Ellison is a 56 y.o. male without significant past medical history presenting to the emergency department with swelling and pain to the right buttock.  He denies any change in bowel movements.  He  thought he felt a sharp bug bite a few days ago. he presented to urgent care 2 days ago and was prescribed antibiotics for possible cellulitis.  Reports his symptoms have been persistent.  Denies any fevers, chills, nausea, vomiting.  Denies similar symptoms in the past  HPI     Home Medications Prior to Admission medications   Medication Sig Start Date End Date Taking? Authorizing Provider  cephALEXin (KEFLEX) 500 MG capsule Take 1 capsule (500 mg total) by mouth 4 (four) times daily for 7 days. 08/28/21 09/04/21 Yes Lonell Grandchild, MD  sulfamethoxazole-trimethoprim (BACTRIM DS) 800-160 MG tablet Take 1 tablet by mouth 2 (two) times daily for 7 days. 08/28/21 09/04/21 Yes Lonell Grandchild, MD  famotidine (PEPCID) 20 MG tablet TAKE 1 TABLET (20 MG TOTAL) BY MOUTH 2 (TWO) TIMES DAILY. 05/03/16   Hoy Register, MD  hydrocortisone (ANUSOL-HC) 2.5 % rectal cream Place 1 application rectally 2 (two) times daily. 04/20/20   Renne Crigler, PA-C  naproxen (NAPROSYN) 500 MG tablet Take 1 tablet (500 mg total) by mouth 2 (two) times daily with a meal. 08/26/21   Wallis Bamberg, PA-C  psyllium (METAMUCIL) 58.6 % packet Take 1 packet by mouth daily. 04/08/16   Anders Simmonds, PA-C      Allergies    Caffeine and Cinnamon    Review of Systems   Review of Systems See HPI Physical Exam Updated Vital Signs BP 114/74   Pulse 60   Temp 98.9 F (37.2 C) (Oral)   Resp 16   Ht 5\' 11"  (1.803 m)   Wt 99.8 kg   SpO2 100%   BMI 30.68 kg/m  Physical Exam Vitals and nursing note reviewed.   Constitutional:      General: He is not in acute distress.    Appearance: Normal appearance.  HENT:     Mouth/Throat:     Mouth: Mucous membranes are moist.  Cardiovascular:     Rate and Rhythm: Normal rate and regular rhythm.  Pulmonary:     Effort: Pulmonary effort is normal. No respiratory distress.     Breath sounds: Normal breath sounds.  Abdominal:     General: Abdomen is flat.     Palpations: Abdomen is soft.     Tenderness: There is no abdominal tenderness.  Genitourinary:    Comments: Chaperoned by RN, right buttock with partially erythematous draining fluctuant mass, approximately 3 x 3 cm, with significant tenderness, more than 10 cm from anal verge Skin:    General: Skin is warm and dry.     Capillary Refill: Capillary refill takes less than 2 seconds.  Neurological:     Mental Status: He is alert and oriented to person, place, and time. Mental status is at baseline.  Psychiatric:        Mood and Affect: Mood normal.        Behavior: Behavior normal.     ED Results / Procedures / Treatments   Labs (all labs ordered are listed, but only abnormal results are displayed) Labs Reviewed - No data to  display  EKG None  Radiology No results found.  Procedures .Marland KitchenIncision and Drainage  Date/Time: 09/03/2021 5:02 PM  Performed by: Lonell Grandchild, MD Authorized by: Lonell Grandchild, MD   Consent:    Consent obtained:  Verbal   Consent given by:  Patient   Risks, benefits, and alternatives were discussed: yes     Risks discussed:  Bleeding, incomplete drainage, pain and infection   Alternatives discussed:  No treatment Universal protocol:    Procedure explained and questions answered to patient or proxy's satisfaction: yes     Patient identity confirmed:  Verbally with patient and arm band Location:    Type:  Abscess   Size:  3x3cm   Location:  Anogenital   Anogenital location: R buttock. Pre-procedure details:    Skin preparation:   Chlorhexidine Sedation:    Sedation type:  None Anesthesia:    Anesthesia method:  Local infiltration   Local anesthetic:  Lidocaine 1% w/o epi Procedure type:    Complexity:  Simple Procedure details:    Ultrasound guidance: yes     Needle aspiration: no     Incision types:  Single straight   Incision depth:  Subcutaneous   Wound management:  Probed and deloculated and extensive cleaning   Drainage:  Purulent   Drainage amount:  Moderate   Wound treatment:  Wound left open   Packing materials:  1/4 in iodoform gauze Post-procedure details:    Procedure completion:  Tolerated well, no immediate complications    Medications Ordered in ED Medications  lidocaine-EPINEPHrine (XYLOCAINE W/EPI) 2 %-1:200000 (PF) injection 20 mL (20 mLs Infiltration Given 08/28/21 1611)    ED Course/ Medical Decision Making/ A&P                           Medical Decision Making Risk Prescription drug management.   56 year old male presenting to the emergency department with abscess  Appears to be simple uncomplicated partially draining abscess, on ultrasound some fluid was present despite it appearing to have some spontaneous drainage, no incision and drainage performed, with significant amount of purulent drainage expressed.  Also with some mild surrounding cellulitis.  Initiated on alternative antibiotic regimen.  Placed packing given size of abscess, instructed patient that he can follow-up with primary doctor or urgent care for packing removal if it does not fall out on its own within the next day or 2.  No sign of necrotizing infection.  Infection not close to anal verge, no concern for perirectal involvement. Will discharge patient to home. All questions answered. Patient comfortable with plan of discharge. Return precautions discussed with patient and specified on the after visit summary.          Final Clinical Impression(s) / ED Diagnoses Final diagnoses:  Abscess    Rx / DC  Orders ED Discharge Orders          Ordered    sulfamethoxazole-trimethoprim (BACTRIM DS) 800-160 MG tablet  2 times daily        08/28/21 1732    cephALEXin (KEFLEX) 500 MG capsule  4 times daily        08/28/21 1732              Lonell Grandchild, MD 09/03/21 1705

## 2022-02-24 IMAGING — CR DG CHEST 2V
2 series · 2 of 2 positions shown · non-contrast
Comparison: 11/23/2015

CLINICAL DATA: Chest pain

EXAM:
CHEST - 2 VIEW

[chest pa]
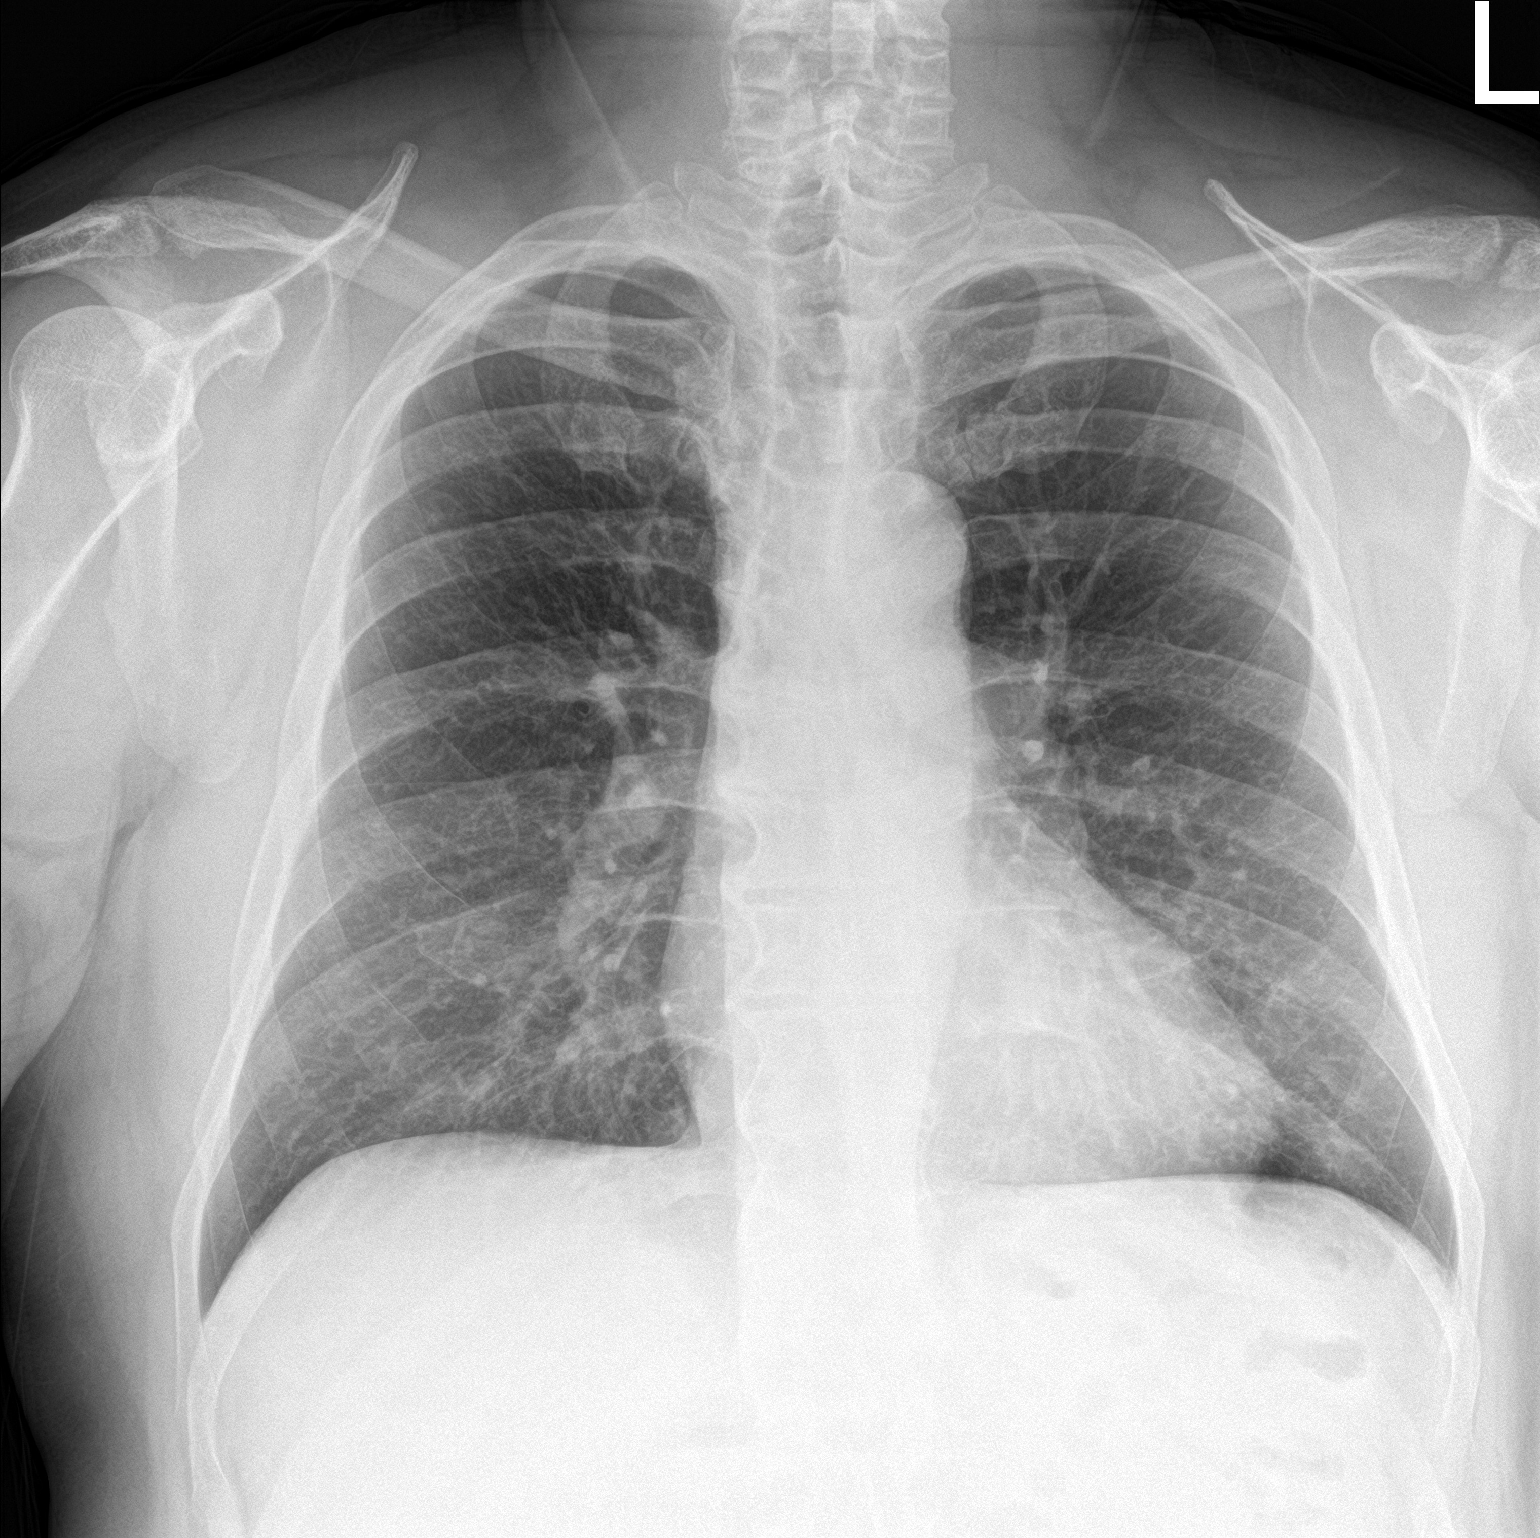

[chest lat]
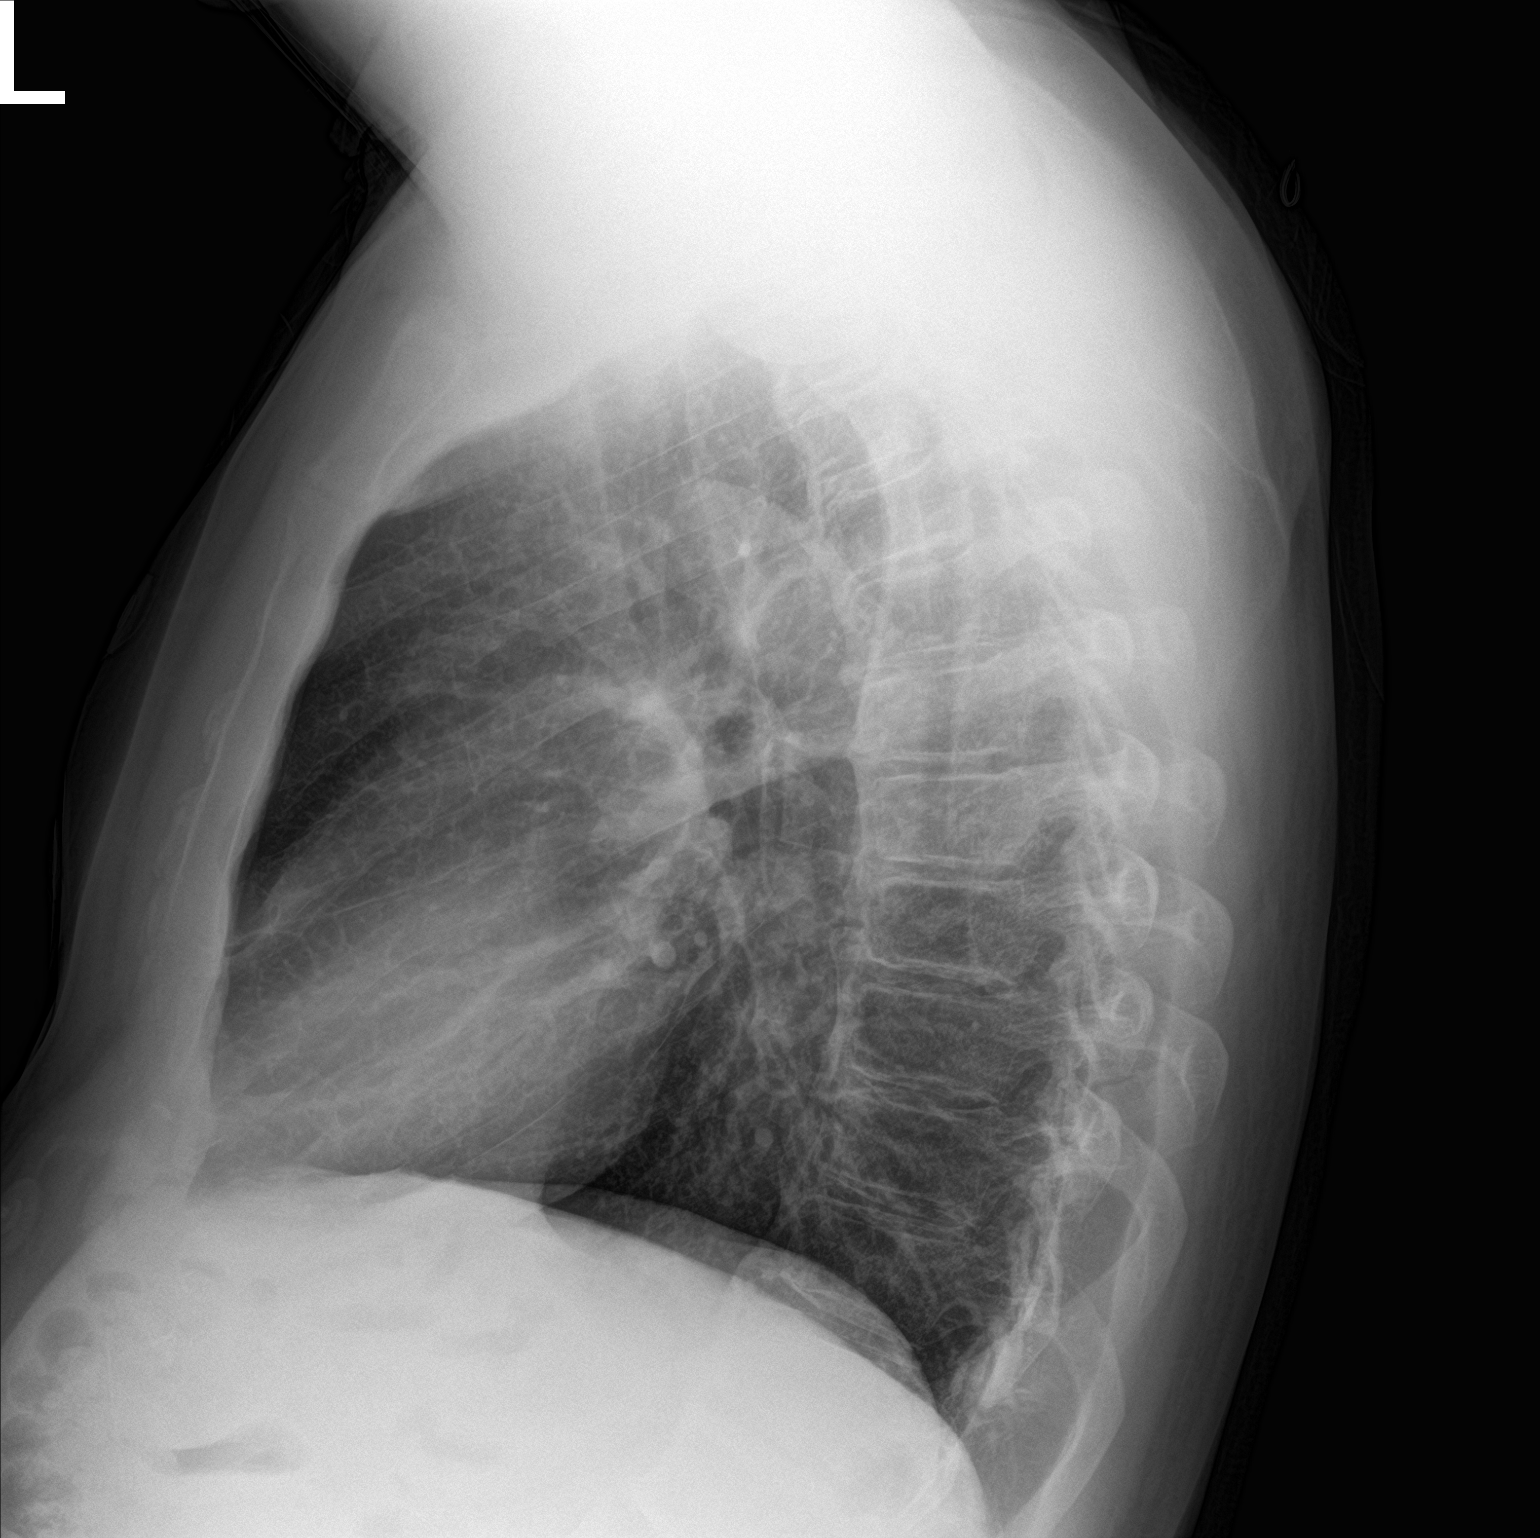

[2 of 2 positions shown; findings below may reference images not displayed]

FINDINGS: The heart size and mediastinal contours are within normal limits.
Both lungs are clear. The visualized skeletal structures are
unremarkable.
IMPRESSION: No active cardiopulmonary disease.

## 2022-07-16 ENCOUNTER — Emergency Department (HOSPITAL_BASED_OUTPATIENT_CLINIC_OR_DEPARTMENT_OTHER): Payer: Self-pay

## 2022-07-16 ENCOUNTER — Emergency Department (HOSPITAL_COMMUNITY)
Admission: EM | Admit: 2022-07-16 | Discharge: 2022-07-16 | Disposition: A | Discharge status: Home / Self Care | Payer: Self-pay | Attending: Emergency Medicine | Admitting: Emergency Medicine

## 2022-07-16 ENCOUNTER — Other Ambulatory Visit: Payer: Self-pay

## 2022-07-16 ENCOUNTER — Encounter (HOSPITAL_COMMUNITY): Payer: Self-pay

## 2022-07-16 ENCOUNTER — Emergency Department (HOSPITAL_COMMUNITY): Payer: Self-pay

## 2022-07-16 DIAGNOSIS — R52 Pain, unspecified: Secondary | ICD-10-CM

## 2022-07-16 DIAGNOSIS — M20021 Boutonniere deformity of right finger(s): Secondary | ICD-10-CM | POA: Insufficient documentation

## 2022-07-16 DIAGNOSIS — M7989 Other specified soft tissue disorders: Secondary | ICD-10-CM

## 2022-07-16 MED ORDER — LIDOCAINE HCL (PF) 1 % IJ SOLN
10.0000 mL | Freq: Once | INTRAMUSCULAR | Status: DC
  Filled 2022-07-16: qty 10

## 2022-07-16 NOTE — Discharge Instructions (Addendum)
It was a pleasure taking care of you today!  Your ultrasound of your leg didn't show concerns for blood clot today. Your xray of your finger didn't show any fractures. You may take over the counter tylenol and ibuprofen as needed for pain for no more than 7 days.   Keep the splint on until you are evaluated by the orthopedist, Dr. Melvyn Novas next week.   Attached is information for the General Surgery group to follow up as needed regarding your leg. Return to the ED if you are experiencing increasing/worsening symptoms.    Fue un placer cuidarte hoy!  La ecografa de su pierna no mostr problemas de cogulos de [place]. La radiografa de tu dedo no mostr ninguna fractura. Puede tomar tylenol e ibuprofeno de venta libre segn sea necesario para el dolor durante no ms de [address].  Mantenga la frula puesta hasta que el ortopedista Dr. Melvyn Novas lo evale la prxima semana.   Adjunto encontrar informacin para que el grupo de [facility] haga el seguimiento necesario con respecto a su pierna. Regrese al servicio de urgencias si experimenta sntomas que aumentan o empeoran.
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
## 2022-07-16 NOTE — Progress Notes (Signed)
Right lower ext venous  has been completed. Refer to [facility] under chart review to view preliminary results.   07/16/2022  1:54 PM Isaac Ellison, Gerarda Gunther

## 2022-12-25 ENCOUNTER — Encounter (HOSPITAL_COMMUNITY): Payer: Self-pay

## 2022-12-25 ENCOUNTER — Emergency Department (HOSPITAL_COMMUNITY)
Admission: EM | Admit: 2022-12-25 | Discharge: 2022-12-25 | Disposition: A | Discharge status: Home / Self Care | Payer: Self-pay | Attending: Emergency Medicine | Admitting: Emergency Medicine

## 2022-12-25 ENCOUNTER — Other Ambulatory Visit: Payer: Self-pay

## 2022-12-25 DIAGNOSIS — K623 Rectal prolapse: Secondary | ICD-10-CM | POA: Insufficient documentation

## 2022-12-25 MED ORDER — LIDOCAINE HCL URETHRAL/MUCOSAL 2 % EX GEL
1.0000 | Freq: Once | CUTANEOUS | Status: AC
  Administered 2022-12-25: 1 via TOPICAL
  Filled 2022-12-25: qty 11

## 2022-12-25 MED ORDER — POLYETHYLENE GLYCOL 3350 17 GM/SCOOP PO POWD: 17.0000 g | Freq: Every day | ORAL | 0 refills | Status: DC

## 2022-12-25 MED ORDER — HYDROCORTISONE (PERIANAL) 2.5 % EX CREA: 1.0000 | TOPICAL_CREAM | Freq: Two times a day (BID) | CUTANEOUS | 0 refills | Status: DC

## 2022-12-25 NOTE — Discharge Instructions (Addendum)
You have been diagnosed with a rectal prolapse.  It is important for you to call and follow-up with a general surgeon for outpatient management.  You also need to reach out to GI specialist for screening colonoscopy as well.  Follow instruction below.

## 2022-12-25 NOTE — ED Notes (Signed)
Pt d/c with instructions. All questions answered. NAD

## 2022-12-25 NOTE — ED Provider Notes (Signed)
[place] EMERGENCY DEPARTMENT AT [facility] Provider Note   CSN: [id number] Arrival date & time: 12/25/22  8295     History  Chief Complaint  Patient presents with   Hemorrhoids    Isaac Ellison is a 57 y.o. male.  The history is provided by the patient and medical records. No language interpreter was used.     57 year old Hispanic speaking male significant history of anxiety, hemorrhoids, presenting complaining of rectal bleeding.  Patient states he has had hemorrhoid for several years and some time having bowel movement here to see streaks of blood.  He was seen and evaluated for this in the past and did receive some medication for which he used for about a month with some improvement.  Last night while having bowel movement he felt significant rectal discomfort and swelling which has persisted since.  This is new for him.  He states his bowel movement was normal and he was not constipated.  He denies any change in his diet denies any abdominal pain no fever and no recent rectal trauma.  Home Medications Prior to Admission medications   Medication Sig Start Date End Date Taking? Authorizing Provider  famotidine (PEPCID) 20 MG tablet TAKE 1 TABLET (20 MG TOTAL) BY MOUTH 2 (TWO) TIMES DAILY. 05/03/16   Hoy Register, MD  hydrocortisone (ANUSOL-HC) 2.5 % rectal cream Place 1 application rectally 2 (two) times daily. 04/20/20   Renne Crigler, PA-C  naproxen (NAPROSYN) 500 MG tablet Take 1 tablet (500 mg total) by mouth 2 (two) times daily with a meal. 08/26/21   Wallis Bamberg, PA-C  psyllium (METAMUCIL) 58.6 % packet Take 1 packet by mouth daily. 04/08/16   Anders Simmonds, PA-C      Allergies    Caffeine and Cinnamon    Review of Systems   Review of Systems  All other systems reviewed and are negative.   Physical Exam Updated Vital Signs BP (!) 151/93   Pulse 67   Temp 98.6 F (37 C) (Oral)   Resp 18   Ht 5\' 11"  (1.803 m)   Wt 95.3 kg   SpO2 100%    BMI 29.30 kg/m  Physical Exam Vitals and nursing note reviewed.  Constitutional:      General: He is not in acute distress.    Appearance: He is well-developed.     Comments: Patient is well-appearing sitting in the chair appears to be in no acute discomfort.  HENT:     Head: Atraumatic.  Eyes:     Conjunctiva/sclera: Conjunctivae normal.  Abdominal:     Palpations: Abdomen is soft.     Tenderness: There is no abdominal tenderness.  Genitourinary:    Comments: Chaperone present during exam.  Evidence of rectal prolapse noted on exam with some localized skin irritation but no obvious thrombosed hemorrhoid.  I attempt to manually apply pressure to this area but patient did not tolerate well. Musculoskeletal:     Cervical back: Neck supple.  Skin:    Findings: No rash.  Neurological:     Mental Status: He is alert.     ED Results / Procedures / Treatments   Labs (all labs ordered are listed, but only abnormal results are displayed) Labs Reviewed - No data to display  EKG None  Radiology No results found.  Procedures Hernia reduction  Date/Time: 12/25/2022 7:58 AM  Performed by: Fayrene Helper, PA-C Authorized by: Fayrene Helper, PA-C  Consent: Verbal consent obtained. Risks and benefits: risks, benefits and  alternatives were discussed Consent given by: patient Patient understanding: patient states understanding of the procedure being performed Patient consent: the patient's understanding of the procedure matches consent given Patient identity confirmed: verbally with patient and arm band Local anesthesia used: yes  Anesthesia: Local anesthesia used: yes Local Anesthetic: topical anesthetic Anesthetic total: 10 mL  Sedation: Patient sedated: no  Comments: Chaperone present during exam.  Table sugar was applied to the rectal prolapse and was left there for approximately 15 minutes.  Afterward 10 cc of lido gel was applied to the perirectal region.  Steady pressure  applied using gloved hand along with manipulation for approximately 10 minutes with successful reduction of partial rectal prolapse.  Patient tolerates with some difficulty.  Pressure dressing applied.       Medications Ordered in ED Medications - No data to display  ED Course/ Medical Decision Making/ A&P                                 Medical Decision Making  BP (!) 151/93   Pulse 67   Temp 98.6 F (37 C) (Oral)   Resp 18   Ht 5\' 11"  (1.803 m)   Wt 95.3 kg   SpO2 100%   BMI 29.30 kg/m   50:67 AM  57 year old Hispanic speaking male significant history of anxiety, hemorrhoids, presenting complaining of rectal bleeding.  Patient states he has had hemorrhoid for several years and some time having bowel movement here to see streaks of blood.  He was seen and evaluated for this in the past and did receive some medication for which he used for about a month with some improvement.  Last night while having bowel movement he felt significant rectal discomfort and swelling which has persisted since.  This is new for him.  He states his bowel movement was normal and he was not constipated.  He denies any change in his diet denies any abdominal pain no fever and no recent rectal trauma.  Exam remarkable for evidence of rectal prolapse.  This is a partial prolapse including the anal and rectal mucosa.  Initial attempt to reduce the prolapse was met with discomfort.  Will apply lido gel and attempt to reduce the prolapse.  I was able to successfully reduce the rectal prolapse.  Pressure dressing applied to the affected area.  Will have patient follow-up with general surgery for outpatient evaluation.  I will also provide patient with a GI follow-up for screening colonoscopy.  Patient discharged home with MiraLAX along with return precaution.        Final Clinical Impression(s) / ED Diagnoses Final diagnoses:  Partial rectal prolapse    Rx / DC Orders ED Discharge Orders           Ordered    polyethylene glycol powder (GLYCOLAX/MIRALAX) 17 GM/SCOOP powder  Daily        12/25/22 0803    hydrocortisone (ANUSOL-HC) 2.5 % rectal cream  2 times daily        12/25/22 0803              Fayrene Helper, PA-C 12/25/22 0804    Rexford Maus, DO 12/25/22 [phone]

## 2022-12-25 NOTE — ED Triage Notes (Signed)
Pt states that he has hemorrhoids that are bleeding and swollen x 1 day. Pt reports having a hx of hemorrhoids.

## 2023-02-01 ENCOUNTER — Emergency Department (HOSPITAL_COMMUNITY)
Admission: EM | Admit: 2023-02-01 | Discharge: 2023-02-01 | Disposition: A | Discharge status: Home / Self Care | Payer: Self-pay | Attending: Emergency Medicine | Admitting: Emergency Medicine

## 2023-02-01 ENCOUNTER — Other Ambulatory Visit: Payer: Self-pay

## 2023-02-01 ENCOUNTER — Encounter (HOSPITAL_COMMUNITY): Payer: Self-pay | Admitting: Emergency Medicine

## 2023-02-01 DIAGNOSIS — K0889 Other specified disorders of teeth and supporting structures: Secondary | ICD-10-CM

## 2023-02-01 DIAGNOSIS — K029 Dental caries, unspecified: Secondary | ICD-10-CM | POA: Insufficient documentation

## 2023-02-01 MED ORDER — AMOXICILLIN-POT CLAVULANATE 875-125 MG PO TABS
1.0000 | ORAL_TABLET | Freq: Once | ORAL | Status: DC
  Filled 2023-02-01: qty 1

## 2023-02-01 MED ORDER — AMOXICILLIN-POT CLAVULANATE 875-125 MG PO TABS: 1.0000 | ORAL_TABLET | Freq: Two times a day (BID) | ORAL | 0 refills | Status: DC

## 2023-02-01 NOTE — ED Provider Notes (Signed)
 [place] EMERGENCY DEPARTMENT AT [facility] Provider Note   CSN: [id number] Arrival date & time: 02/01/23  2057     History  Chief Complaint  Patient presents with   Dental Pain    Isaac Ellison is a 57 y.o. male.  57 year old male with prior medical history as detailed below presents for evaluation.  Patient complains of pain to the left lower molars.  Onset of symptoms 24 hours.  No fever.  Patient reports prior history of dental pain in similar area.  The history is provided by the patient and medical records.       Home Medications Prior to Admission medications   Medication Sig Start Date End Date Taking? Authorizing Provider  amoxicillin -clavulanate (AUGMENTIN ) 875-125 MG tablet Take 1 tablet by mouth every 12 (twelve) hours. 02/01/23  Yes Ericberto Padget, Maude BROCKS, MD  famotidine  (PEPCID ) 20 MG tablet TAKE 1 TABLET (20 MG TOTAL) BY MOUTH 2 (TWO) TIMES DAILY. 05/03/16   Delbert Clam, MD  hydrocortisone  (ANUSOL -HC) 2.5 % rectal cream Place 1 Application rectally 2 (two) times daily. 12/25/22   Nivia Colon, PA-C  naproxen  (NAPROSYN ) 500 MG tablet Take 1 tablet (500 mg total) by mouth 2 (two) times daily with a meal. 08/26/21   Christopher Savannah, PA-C  polyethylene glycol powder (GLYCOLAX /MIRALAX ) 17 GM/SCOOP powder Take 17 g by mouth daily. 12/25/22   Nivia Colon, PA-C  psyllium (METAMUCIL) 58.6 % packet Take 1 packet by mouth daily. 04/08/16   [place] Jon HERO, PA-C      Allergies    Caffeine and Cinnamon    Review of Systems   Review of Systems  All other systems reviewed and are negative.   Physical Exam Updated Vital Signs BP 136/87 (BP Location: Right Arm)   Pulse 82   Temp 98.5 F (36.9 C) (Oral)   Resp 16   Ht 5' 10 (1.778 m)   Wt 95.3 kg   SpO2 96%   BMI 30.13 kg/m  Physical Exam Vitals and nursing note reviewed.  Constitutional:      General: He is not in acute distress.    Appearance: Normal appearance. He is well-developed.  HENT:      Head: Normocephalic and atraumatic.     Mouth/Throat:     Comments: Diffuse dental decay.  No discrete dental abscess noted. Eyes:     Conjunctiva/sclera: Conjunctivae normal.     Pupils: Pupils are equal, round, and reactive to light.  Cardiovascular:     Rate and Rhythm: Normal rate and regular rhythm.     Heart sounds: Normal heart sounds.  Pulmonary:     Effort: Pulmonary effort is normal. No respiratory distress.     Breath sounds: Normal breath sounds.  Abdominal:     General: There is no distension.     Palpations: Abdomen is soft.     Tenderness: There is no abdominal tenderness.  Musculoskeletal:        General: No deformity. Normal range of motion.     Cervical back: Normal range of motion and neck supple.  Skin:    General: Skin is warm and dry.  Neurological:     General: No focal deficit present.     Mental Status: He is alert and oriented to person, place, and time.     ED Results / Procedures / Treatments   Labs (all labs ordered are listed, but only abnormal results are displayed) Labs Reviewed - No data to display  EKG None  Radiology No results  found.  Procedures Procedures    Medications Ordered in ED Medications  amoxicillin -clavulanate (AUGMENTIN ) 875-125 MG per tablet 1 tablet (has no administration in time range)    ED Course/ Medical Decision Making/ A&P                                 Medical Decision Making Risk Prescription drug management.    Medical Screen Complete  This patient presented to the ED with complaint of dental pain.  This complaint involves an extensive number of treatment options. The initial differential diagnosis includes, but is not limited to, dental infection  This presentation is: Acute, Self-Limited, Previously Undiagnosed, Uncertain Prognosis, and Complicated  Patient with complaint of dental pain.  Symptoms and exam are suggestive of early dental infection.  Antibiotics administered here in the  ED.  Patient given prescription for same.  Patient understands need for close outpatient follow-up with dentistry. Additional history obtained: External records from outside sources obtained and reviewed including prior ED visits and prior Inpatient records.    Problem List / ED Course:  Dental pain   Reevaluation:  After the interventions noted above, I reevaluated the patient and found that they have: improved  Disposition:  After consideration of the diagnostic results and the patients response to treatment, I feel that the patent would benefit from close outpatient follow-up.          Final Clinical Impression(s) / ED Diagnoses Final diagnoses:  Pain, dental    Rx / DC Orders ED Discharge Orders          Ordered    amoxicillin -clavulanate (AUGMENTIN ) 875-125 MG tablet  Every 12 hours        02/01/23 2238              Laurice Maude BROCKS, MD 02/01/23 2244

## 2023-02-01 NOTE — ED Triage Notes (Signed)
Pt reports Left lower toothache x 2 days. Reports he has been taking advil w/ no relief.

## 2023-02-01 NOTE — Discharge Instructions (Addendum)
Return for any problem.  ?

## 2023-11-09 ENCOUNTER — Encounter (HOSPITAL_COMMUNITY): Payer: Self-pay | Admitting: Pharmacy Technician

## 2023-11-09 ENCOUNTER — Other Ambulatory Visit: Payer: Self-pay

## 2023-11-09 ENCOUNTER — Emergency Department (HOSPITAL_COMMUNITY)
Admission: EM | Admit: 2023-11-09 | Discharge: 2023-11-10 | Disposition: A | Discharge status: Home / Self Care | Payer: Self-pay

## 2023-11-09 DIAGNOSIS — M79604 Pain in right leg: Secondary | ICD-10-CM | POA: Insufficient documentation
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
## 2023-11-10 ENCOUNTER — Emergency Department (EMERGENCY_DEPARTMENT_HOSPITAL): Payer: Self-pay

## 2023-11-10 DIAGNOSIS — M79661 Pain in right lower leg: Secondary | ICD-10-CM

## 2023-11-10 MED ORDER — NAPROXEN 500 MG PO TABS: 500.0000 mg | ORAL_TABLET | Freq: Two times a day (BID) | ORAL | 0 refills | Status: AC

## 2023-11-10 MED ORDER — KETOROLAC TROMETHAMINE 15 MG/ML IJ SOLN
15.0000 mg | Freq: Once | INTRAMUSCULAR | Status: AC
  Administered 2023-11-10: 15 mg via INTRAMUSCULAR
  Filled 2023-11-10: qty 1
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
